# Patient Record
Sex: Female | Born: 1990
Health system: Southern US, Community
[De-identification: ages and names within clinical notes are randomized; demographics above are authoritative.]

## PROBLEM LIST (undated history)

## (undated) ENCOUNTER — Inpatient Hospital Stay: Payer: Self-pay

## (undated) DIAGNOSIS — B9689 Other specified bacterial agents as the cause of diseases classified elsewhere: Secondary | ICD-10-CM

## (undated) DIAGNOSIS — F32A Depression, unspecified: Secondary | ICD-10-CM

## (undated) DIAGNOSIS — I10 Essential (primary) hypertension: Secondary | ICD-10-CM

## (undated) DIAGNOSIS — N760 Acute vaginitis: Secondary | ICD-10-CM

## (undated) DIAGNOSIS — Z302 Encounter for sterilization: Secondary | ICD-10-CM

## (undated) DIAGNOSIS — G43909 Migraine, unspecified, not intractable, without status migrainosus: Secondary | ICD-10-CM

## (undated) DIAGNOSIS — E119 Type 2 diabetes mellitus without complications: Secondary | ICD-10-CM

## (undated) HISTORY — PX: ABDOMINAL SURGERY: SHX537

---
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
## 2008-07-29 ENCOUNTER — Emergency Department: Payer: Self-pay | Admitting: Emergency Medicine

## 2010-03-23 ENCOUNTER — Emergency Department: Payer: Self-pay | Admitting: Emergency Medicine

## 2011-09-03 ENCOUNTER — Emergency Department: Payer: Self-pay | Admitting: Emergency Medicine

## 2011-09-03 LAB — URINALYSIS, COMPLETE
Bacteria: NONE SEEN
Bilirubin,UR: NEGATIVE
Blood: NEGATIVE
Glucose,UR: NEGATIVE mg/dL (ref 0–75)
Ketone: NEGATIVE
Nitrite: NEGATIVE
RBC,UR: <1 /HPF (ref 0–5)
Specific Gravity: 1.008 (ref 1.003–1.030)
Squamous Epithelial: NONE SEEN
WBC UR: <1 /HPF (ref 0–5)

## 2011-09-03 LAB — COMPREHENSIVE METABOLIC PANEL
Albumin: 4.1 g/dL (ref 3.4–5.0)
Alkaline Phosphatase: 41 U/L — ABNORMAL LOW (ref 50–136)
BUN: 9 mg/dL (ref 7–18)
Bilirubin,Total: 0.2 mg/dL (ref 0.2–1.0)
Chloride: 104 mmol/L (ref 98–107)
Co2: 23 mmol/L (ref 21–32)
Creatinine: 0.52 mg/dL — ABNORMAL LOW (ref 0.60–1.30)
EGFR (Non-African Amer.): >60
Glucose: 93 mg/dL (ref 65–99)
Osmolality: 270 (ref 275–301)
SGOT(AST): 17 U/L (ref 15–37)
SGPT (ALT): 23 U/L
Sodium: 136 mmol/L (ref 136–145)
Total Protein: 7.8 g/dL (ref 6.4–8.2)

## 2011-09-03 LAB — CBC
HCT: 39.6 % (ref 35.0–47.0)
HGB: 13.3 g/dL (ref 12.0–16.0)
MCV: 93 fL (ref 80–100)
RBC: 4.26 10*6/uL (ref 3.80–5.20)
RDW: 12.5 % (ref 11.5–14.5)
WBC: 13.3 10*3/uL — ABNORMAL HIGH (ref 3.6–11.0)

## 2011-09-03 LAB — PREGNANCY, URINE: Pregnancy Test, Urine: POSITIVE m[IU]/mL

## 2011-09-04 ENCOUNTER — Emergency Department: Payer: Self-pay | Admitting: Emergency Medicine

## 2011-09-11 ENCOUNTER — Ambulatory Visit: Payer: Self-pay | Admitting: Obstetrics and Gynecology

## 2011-10-28 ENCOUNTER — Encounter: Payer: Self-pay | Admitting: Maternal & Fetal Medicine

## 2012-01-13 ENCOUNTER — Observation Stay: Payer: Self-pay | Admitting: Emergency Medicine

## 2012-03-31 ENCOUNTER — Emergency Department: Payer: Self-pay | Admitting: Emergency Medicine

## 2012-04-10 ENCOUNTER — Emergency Department: Payer: Self-pay | Admitting: Emergency Medicine

## 2012-05-03 ENCOUNTER — Inpatient Hospital Stay: Payer: Self-pay | Admitting: Obstetrics and Gynecology

## 2012-05-03 LAB — PROTEIN / CREATININE RATIO, URINE: Protein/Creat. Ratio: 188 mg/gCREAT (ref 0–200)

## 2012-05-03 LAB — PLATELET COUNT: Platelet: 260 10*3/uL (ref 150–440)

## 2012-05-03 LAB — BASIC METABOLIC PANEL
BUN: 6 mg/dL — ABNORMAL LOW (ref 7–18)
Calcium, Total: 8.7 mg/dL (ref 8.5–10.1)
Creatinine: 0.59 mg/dL — ABNORMAL LOW (ref 0.60–1.30)
EGFR (African American): >60
EGFR (Non-African Amer.): >60
Glucose: 100 mg/dL — ABNORMAL HIGH (ref 65–99)
Potassium: 3.4 mmol/L — ABNORMAL LOW (ref 3.5–5.1)
Sodium: 138 mmol/L (ref 136–145)

## 2012-05-03 LAB — SGOT (AST)(ARMC): SGOT(AST): 18 U/L (ref 15–37)

## 2012-05-03 LAB — HEMATOCRIT: HCT: 36 % (ref 35.0–47.0)

## 2012-05-03 LAB — URIC ACID: Uric Acid: 3.4 mg/dL (ref 2.6–6.0)

## 2012-05-05 LAB — HEMATOCRIT: HCT: 31.9 % — ABNORMAL LOW (ref 35.0–47.0)

## 2012-05-14 ENCOUNTER — Emergency Department: Payer: Self-pay | Admitting: Emergency Medicine

## 2012-05-14 LAB — BASIC METABOLIC PANEL
Anion Gap: 4 — ABNORMAL LOW (ref 7–16)
Calcium, Total: 8.9 mg/dL (ref 8.5–10.1)
Chloride: 107 mmol/L (ref 98–107)
Co2: 28 mmol/L (ref 21–32)
Creatinine: 0.63 mg/dL (ref 0.60–1.30)
EGFR (African American): >60
EGFR (Non-African Amer.): >60
Sodium: 139 mmol/L (ref 136–145)

## 2012-05-14 LAB — CBC
HGB: 12.9 g/dL (ref 12.0–16.0)
MCH: 30.5 pg (ref 26.0–34.0)
MCV: 90 fL (ref 80–100)
Platelet: 413 10*3/uL (ref 150–440)
RDW: 13 % (ref 11.5–14.5)
WBC: 8.7 10*3/uL (ref 3.6–11.0)

## 2012-05-14 LAB — CK TOTAL AND CKMB (NOT AT ARMC): CK, Total: 98 U/L (ref 21–215)

## 2012-05-14 LAB — TROPONIN I: Troponin-I: <0.02 ng/mL

## 2012-05-14 LAB — LIPASE, BLOOD: Lipase: 179 U/L (ref 73–393)

## 2013-02-18 ENCOUNTER — Emergency Department: Payer: Self-pay | Admitting: Emergency Medicine

## 2014-02-18 ENCOUNTER — Ambulatory Visit: Payer: Self-pay | Admitting: Obstetrics and Gynecology

## 2014-03-15 DIAGNOSIS — G43009 Migraine without aura, not intractable, without status migrainosus: Secondary | ICD-10-CM | POA: Insufficient documentation

## 2014-06-14 ENCOUNTER — Emergency Department: Payer: Self-pay | Admitting: Emergency Medicine

## 2014-07-21 ENCOUNTER — Emergency Department: Payer: Self-pay | Admitting: Emergency Medicine

## 2014-08-24 ENCOUNTER — Emergency Department
Admit: 2014-08-24 | Disposition: A | Discharge status: Home / Self Care | Payer: Self-pay | Admitting: Emergency Medicine

## 2014-09-05 ENCOUNTER — Ambulatory Visit
Admit: 2014-09-05 | Disposition: A | Discharge status: Home / Self Care | Payer: Self-pay | Attending: Unknown Physician Specialty | Admitting: Unknown Physician Specialty

## 2014-09-13 NOTE — Discharge Summary (Signed)
PATIENT NAME:  [name], Susan Shelton DATE OF BIRTH:  [birth year]  DATE OF ADMISSION:  05/03/2012 DATE OF DISCHARGE:  05/07/2012  PRINCIPAL PROCEDURE: Induction followed by primary low transverse cesarean section for active phase arrest and nonreassuring fetal monitoring.   HOSPITAL COURSE: The patient was brought in for induction at 39 + 1 day for chronic hypertension. On hospital day number two, the patient labored to 4 cm and did not progress past 4 cm. The fetus developed a 11 minute fetal bradycardic episode which prompted primary low transverse cesarean section. The patient postoperatively did well. On postoperative day number one, hematocrit 31.9%. On postoperative day number three, the patient is tolerating a regular diet without complaints. She is taking Norco 5/325 mg for pain relief. The patient wishes to go on Micronor for contraception.   DISCHARGE CONDITION: Good.  DISCHARGE MEDICATIONS:  1. Norco 5/325 mg 1 to 2 tablets every 4 to 6 hours. 2. Naprosyn 500 mg 1 tablet every 12 hours. 3. Micronor 1 tablet daily.   DISCHARGE FOLLOWUP: The patient will follow up with Dr. Feliberto GottronSchermerhorn in two weeks for wound care or before if she has wound drainage, fever, nausea, vomiting, or increasing abdominal pain.  ____________________________ Suzy Bouchardhomas J. Schermerhorn, MD tjs:slb D: 05/07/2012 09:32:24 ET T: 05/07/2012 10:08:12 ET JOB#: [id number]  cc: Suzy Bouchardhomas J. Schermerhorn, MD, <Dictator>  Suzy BouchardHOMAS J SCHERMERHORN MD ELECTRONICALLY SIGNED 05/25/2012 22:23

## 2014-09-13 NOTE — Op Note (Signed)
PATIENT NAME:  Susan Shelton, Susan Shelton MR#:  [id number] DATE OF BIRTH:  01-Mar-1991  DATE OF PROCEDURE:  05/04/2012  PREOPERATIVE DIAGNOSES:  1. Term gestation.  2. Gestational hypertension.  3. Nonreassuring fetal monitoring.   POSTOPERATIVE DIAGNOSES: 1. Term gestation.  2. Gestational hypertension.  3. Nonreassuring fetal monitoring.   PROCEDURE: Primary low transverse Cesarean section.   SURGEON: Suzy Bouchardhomas J. Jettie Mannor, MD   FIRST ASSISTANT: Acquanetta BellingAngela Lugiano, CNM    ANESTHESIA: Spinal.   INDICATIONS: This is a 24 year old gravida 1 para 0 at 5139 + 2 days. The patient was brought in for an induction of labor given her gestational hypertension. The patient on hospital day #2 progressed to 4 cm, did not make continued progression and had an 11 minute decel which responded to intrauterine resuscitation. The patient was brought to the operating room for primary low transverse Cesarean section.   PROCEDURE: After adequate spinal anesthesia, the patient was placed in the dorsal supine position with a hip roll under the right side. The patient's abdomen was prepped and draped in a normal sterile fashion. She did receive 2 grams IV Ancef prior to commencement of the case. Pfannenstiel incision was made two fingerbreadths above the symphysis pubis. Sharp dissection was used to identify the fascia. Fascia was opened in the midline and opened in a transverse fashion. Superior aspect of the fascia was grasped with Kocher clamps and the recti muscles dissected free. Inferior aspect of the fascia was grasped with Kocher clamps and the pyramidalis muscle was dissected free. Entry into the peritoneal cavity was accomplished sharply. Vesicouterine peritoneal fold was identified and opened in a transverse fashion and the bladder flap was created and the bladder was reflected inferiorly. A low transverse uterine incision was made. Upon entry into the endometrial cavity, clear fluid resulted. OP presentation was noted.  Fetal head was delivered through the incision followed by shoulders, arms, and legs. Legs were noted to be folded and a wrapped umbilical cord around the right leg. Female was vigorous, passed to Dr. Abundio MiuHorowitz who assigned Apgar scores of 9 and 9. Placenta was delivered manually and the uterus was exteriorized. Intravenous Pitocin was administered 40 units per 1000 mL bag of fluid. Endometrial cavity was wiped clean with laparotomy tape. The uterine incision was closed with 1 chromic suture in a running locking fashion with good approximation of edges. Good hemostasis was noted. One additional figure-of-eight suture was required for hemostasis. Fallopian tubes and ovaries appeared normal. The posterior cul-de-sac was irrigated and suctioned. The uterus was placed back into the abdominal cavity. Paracolic gutters were wiped cleaned bilaterally. Uterine incision again appeared hemostatic. Interceed applied over the uterine incision and the fascia was then closed with 0 Vicryl suture in a running nonlocking fashion with good approximation of edges. Good hemostasis was noted. Subcutaneous tissues were irrigated and bovied for hemostasis and the skin was reapplied with Insorb absorbable staples. The patient tolerated the procedure well. Estimated blood loss 500 mL. Intraoperative fluids 1200 mL. The patient was taken to the recovery room in good condition.   ____________________________ Suzy Bouchardhomas J. Mahmoud Blazejewski, MD tjs:drc D: 05/04/2012 18:22:05 ET T: 05/05/2012 09:51:17 ET JOB#: [id number]  cc: Suzy Bouchardhomas J. Gillis Boardley, MD, <Dictator> Suzy BouchardHOMAS J Lydiana Milley MD ELECTRONICALLY SIGNED 05/25/2012 22:23

## 2014-10-04 NOTE — H&P (Signed)
L&D Evaluation:  History:   HPI 24 y/o G1 @ 39/2 wks EDC 05/09/12 here for IOL due to Gestational hypertension Aldomet 250mg  TID, NL US growth and AFI. Care @ [facility]. Obesity, GBS negative. occasinal contraction, denies leaking fluid or vaginal bleeding, baby is active. Denies headache, nausea, visual disturbances, vomiting, RUQ or epigastric pain.    Presents with above    Patient's Medical History Hypertension    Medications Pre Natal Vitamins    Allergies NKDA    Social History none    Family History Non-Contributory   ROS:   ROS All systems were reviewed.  HEENT, CNS, GI, GU, Respiratory, CV, Renal and Musculoskeletal systems were found to be normal.   Exam:   Vital Signs stable  104/54   138/58   139/67    Urine Protein not completed    General no apparent distress    Mental Status clear    Chest clear    Heart normal sinus rhythm    Abdomen gravid, non-tender    Estimated Fetal Weight Average for gestational age    Fetal Position vtx    Fundal Height term    Back no CVAT    Edema no edema    Reflexes 2+    Clonus negative    Pelvic no external lesions, 2cm 50% vtx @ -2 no show BOWI intact    Mebranes Intact    FHT normal rate with no decels, baseline 140's 150's avg variability with multiple accels    Fetal Heart Rate 156    Ucx irregular, irregular mild    Skin dry    Lymph no lymphadenopathy   Impression:   Impression IOL @ term Gestational Hypertension   Plan:   Plan EFM/NST, monitor contractions and for cervical change, monitor BP, PIH panel    Comments Cervidil removed, will begin pitocin. PIH panel NL, Metabolic panel potassium 3.4. Partner present, dozing. DC what to expect with IOL, questions answered, consents signed prior. Explained pain management options with labor progress. TJS aware of asssessment and plan of care.   Electronic Signatures: Albertina ParrLugiano, Demarus Latterell B (CNM)  (Signed 09-Dec-13 08:48)  Authored: L&D Evaluation   Last  Updated: 09-Dec-13 08:48 by Albertina ParrLugiano, Roxanne Panek B (CNM)

## 2015-01-18 ENCOUNTER — Emergency Department
Admission: EM | Admit: 2015-01-18 | Discharge: 2015-01-18 | Disposition: A | Discharge status: Home / Self Care | Payer: 59 | Attending: Student | Admitting: Student

## 2015-01-18 ENCOUNTER — Encounter: Payer: Self-pay | Admitting: *Deleted

## 2015-01-18 DIAGNOSIS — Y998 Other external cause status: Secondary | ICD-10-CM | POA: Insufficient documentation

## 2015-01-18 DIAGNOSIS — S39012A Strain of muscle, fascia and tendon of lower back, initial encounter: Secondary | ICD-10-CM | POA: Diagnosis not present

## 2015-01-18 DIAGNOSIS — Y92410 Unspecified street and highway as the place of occurrence of the external cause: Secondary | ICD-10-CM | POA: Diagnosis not present

## 2015-01-18 DIAGNOSIS — S20212A Contusion of left front wall of thorax, initial encounter: Secondary | ICD-10-CM | POA: Diagnosis not present

## 2015-01-18 DIAGNOSIS — Z720 Tobacco use: Secondary | ICD-10-CM | POA: Insufficient documentation

## 2015-01-18 DIAGNOSIS — S161XXA Strain of muscle, fascia and tendon at neck level, initial encounter: Secondary | ICD-10-CM | POA: Diagnosis not present

## 2015-01-18 DIAGNOSIS — S8001XA Contusion of right knee, initial encounter: Secondary | ICD-10-CM | POA: Insufficient documentation

## 2015-01-18 DIAGNOSIS — S199XXA Unspecified injury of neck, initial encounter: Secondary | ICD-10-CM | POA: Diagnosis present

## 2015-01-18 DIAGNOSIS — Y9389 Activity, other specified: Secondary | ICD-10-CM | POA: Insufficient documentation

## 2015-01-18 HISTORY — DX: Migraine, unspecified, not intractable, without status migrainosus: G43.909

## 2015-01-18 MED ORDER — CYCLOBENZAPRINE HCL 10 MG PO TABS: 10.0000 mg | ORAL_TABLET | Freq: Three times a day (TID) | ORAL | Status: DC | PRN

## 2015-01-18 MED ORDER — IBUPROFEN 800 MG PO TABS
800.0000 mg | ORAL_TABLET | Freq: Once | ORAL | Status: AC
  Administered 2015-01-18: 800 mg via ORAL
  Filled 2015-01-18: qty 1

## 2015-01-18 MED ORDER — IBUPROFEN 800 MG PO TABS: 800.0000 mg | ORAL_TABLET | Freq: Three times a day (TID) | ORAL | Status: DC | PRN

## 2015-01-18 MED ORDER — TRAMADOL HCL 50 MG PO TABS: 50.0000 mg | ORAL_TABLET | Freq: Four times a day (QID) | ORAL | Status: DC | PRN

## 2015-01-18 MED ORDER — METHOCARBAMOL 500 MG PO TABS
1000.0000 mg | ORAL_TABLET | Freq: Once | ORAL | Status: AC
  Administered 2015-01-18: 1000 mg via ORAL
  Filled 2015-01-18: qty 2

## 2015-01-18 MED ORDER — METHOCARBAMOL 1000 MG/10ML IJ SOLN: 1000.0000 mg | Freq: Once | INTRAMUSCULAR | Status: DC

## 2015-01-18 MED ORDER — TRAMADOL HCL 50 MG PO TABS
50.0000 mg | ORAL_TABLET | Freq: Once | ORAL | Status: AC
  Administered 2015-01-18: 50 mg via ORAL
  Filled 2015-01-18: qty 1
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
## 2015-02-24 DIAGNOSIS — R7303 Prediabetes: Secondary | ICD-10-CM | POA: Insufficient documentation

## 2015-02-24 DIAGNOSIS — Z6829 Body mass index (BMI) 29.0-29.9, adult: Secondary | ICD-10-CM | POA: Insufficient documentation

## 2015-03-07 ENCOUNTER — Encounter: Payer: Self-pay | Admitting: Emergency Medicine

## 2015-03-07 ENCOUNTER — Emergency Department
Admission: EM | Admit: 2015-03-07 | Discharge: 2015-03-07 | Disposition: A | Discharge status: Home / Self Care | Payer: 59 | Attending: Emergency Medicine | Admitting: Emergency Medicine

## 2015-03-07 ENCOUNTER — Emergency Department: Payer: 59

## 2015-03-07 DIAGNOSIS — G8929 Other chronic pain: Secondary | ICD-10-CM | POA: Insufficient documentation

## 2015-03-07 DIAGNOSIS — R51 Headache: Secondary | ICD-10-CM | POA: Diagnosis not present

## 2015-03-07 DIAGNOSIS — Z720 Tobacco use: Secondary | ICD-10-CM | POA: Diagnosis not present

## 2015-03-07 DIAGNOSIS — R519 Headache, unspecified: Secondary | ICD-10-CM

## 2015-03-07 DIAGNOSIS — Z3202 Encounter for pregnancy test, result negative: Secondary | ICD-10-CM | POA: Diagnosis not present

## 2015-03-07 LAB — POCT PREGNANCY, URINE: PREG TEST UR: NEGATIVE

## 2015-03-07 LAB — GLUCOSE, CAPILLARY: Glucose-Capillary: 96 mg/dL (ref 65–99)

## 2015-03-07 MED ORDER — KETOROLAC TROMETHAMINE 30 MG/ML IJ SOLN
30.0000 mg | Freq: Once | INTRAMUSCULAR | Status: AC
  Administered 2015-03-07: 30 mg via INTRAVENOUS
  Filled 2015-03-07: qty 1

## 2015-03-07 MED ORDER — SODIUM CHLORIDE 0.9 % IV BOLUS (SEPSIS)
1000.0000 mL | Freq: Once | INTRAVENOUS | Status: AC
Start: 2015-03-07 — End: 2015-03-07
  Administered 2015-03-07: 1000 mL via INTRAVENOUS

## 2015-03-07 MED ORDER — DIPHENHYDRAMINE HCL 50 MG/ML IJ SOLN
25.0000 mg | Freq: Once | INTRAMUSCULAR | Status: AC
  Administered 2015-03-07: 25 mg via INTRAVENOUS
  Filled 2015-03-07: qty 1

## 2015-03-07 MED ORDER — PROCHLORPERAZINE EDISYLATE 5 MG/ML IJ SOLN
10.0000 mg | Freq: Once | INTRAMUSCULAR | Status: AC
  Administered 2015-03-07: 10 mg via INTRAVENOUS
  Filled 2015-03-07 (×2): qty 2

## 2015-03-07 MED ORDER — BUTALBITAL-APAP-CAFFEINE 50-325-40 MG PO TABS
1.0000 | ORAL_TABLET | Freq: Four times a day (QID) | ORAL | Status: DC | PRN
Start: 2015-03-07 — End: 2016-02-23

## 2015-03-07 NOTE — ED Notes (Signed)
Pt presents to ED from home via EMS with headache that has lasted all day accompanied by nausea and at times hyperventilating. Pt states this feels like a migraine headache (hx). Pt describes this as a continuous throbbing pain around head that radiates to neck. 10/10 pain

## 2015-03-07 NOTE — ED Provider Notes (Addendum)
[facility] Emergency Department Provider Note  ____________________________________________  Time seen: Approximately 7:05 PM  I have reviewed the triage vital signs and the nursing notes.   HISTORY  Chief Complaint Migraine    HPI Susan Shelton is a 24 y.o. female with a history of headaches was presenting today with worsening headaches over the past 3-4 months. She says that she is now getting her headaches every weekend. She says that this started out small and increased to a 10 out of 10 slowly. Her headache today started at 11 AM and progressed to a 10 out of 10 over the course the day. She has nausea but no vomiting. She has photophobia. It occasionally causes her to hyperventilate. Has been using tramadol at home without relief. Has also tried ibuprofen at home without relief. Says has been to the emergency department multiple times for this for medications. Has also discussed it with her primary care doctor, Dr. Thedore Mins. Denies any focal weakness or dizziness.No family history of brain cancer.   Past Medical History  Diagnosis Date  . Migraines     There are no active problems to display for this patient.   History reviewed. No pertinent past surgical history.  Current Outpatient Rx  Name  Route  Sig  Dispense  Refill  . cyclobenzaprine (FLEXERIL) 10 MG tablet   Oral   Take 1 tablet (10 mg total) by mouth every 8 (eight) hours as needed for muscle spasms.   15 tablet   0   . ibuprofen (ADVIL,MOTRIN) 800 MG tablet   Oral   Take 1 tablet (800 mg total) by mouth every 8 (eight) hours as needed for moderate pain.   15 tablet   0   . traMADol (ULTRAM) 50 MG tablet   Oral   Take 1 tablet (50 mg total) by mouth every 6 (six) hours as needed for moderate pain.   12 tablet   0     Allergies Review of patient's allergies indicates no known allergies.  History reviewed. No pertinent family history.  Social History Social History   Substance Use Topics  . Smoking status: Current Every Day Smoker -- 1.00 packs/day    Types: Cigarettes  . Smokeless tobacco: None  . Alcohol Use: No    Review of Systems Constitutional: No fever/chills Eyes: No visual changes. ENT: No sore throat. Cardiovascular: Denies chest pain. Respiratory: Denies shortness of breath. Gastrointestinal: No abdominal pain.  No nausea, no vomiting.  No diarrhea.  No constipation. Genitourinary: Negative for dysuria. Musculoskeletal: Negative for back pain. Skin: Negative for rash. Neurological: Negative for focal weakness or numbness.  10-point ROS otherwise negative.  ____________________________________________   PHYSICAL EXAM:  VITAL SIGNS: ED Triage Vitals  Enc Vitals Group     BP 03/07/15 1841 121/76 mmHg     Pulse Rate 03/07/15 1841 70     Resp 03/07/15 1841 18     Temp 03/07/15 1841 97.9 F (36.6 C)     Temp Source 03/07/15 1841 Oral     SpO2 03/07/15 1841 100 %     Weight 03/07/15 1841 168 lb (76.204 kg)     Height 03/07/15 1841  (1.6 m)     Head Cir --      Peak Flow --      Pain Score 03/07/15 1842 10     Pain Loc --      Pain Edu? --      Excl. in GC? --  Constitutional: Alert and oriented. Well appearing and in no acute distress. Eyes: Conjunctivae are normal. PERRL. EOMI. Head: Atraumatic. Nose: No congestion/rhinnorhea. Mouth/Throat: Mucous membranes are moist.  Oropharynx non-erythematous. Neck: No stridor.   Cardiovascular: Normal rate, regular rhythm. Grossly normal heart sounds.  Good peripheral circulation. Respiratory: Normal respiratory effort.  No retractions. Lungs CTAB. Gastrointestinal: Soft and nontender. No distention. No abdominal bruits. No CVA tenderness. Musculoskeletal: No lower extremity tenderness nor edema.  No joint effusions. Neurologic:  Normal speech and language. No gross focal neurologic deficits are appreciated. No gait instability. No ataxia on finger to nose testing. No  nystagmus.  Skin:  Skin is warm, dry and intact. No rash noted. Psychiatric: Mood and affect are normal. Speech and behavior are normal.  ____________________________________________   LABS (all labs ordered are listed, but only abnormal results are displayed)  Labs Reviewed  POC URINE PREG, ED   ____________________________________________  EKG   ____________________________________________  RADIOLOGY  No acute findings on the CAT scan of the brain. ____________________________________________   PROCEDURES    ____________________________________________   INITIAL IMPRESSION / ASSESSMENT AND PLAN / ED COURSE  Pertinent labs & imaging results that were available during my care of the patient were reviewed by me and considered in my medical decision making (see chart for details).  ----------------------------------------- 8:47 PM on 03/07/2015 -----------------------------------------  Patient resting comfortably at this time but still a 7 out of 10 pain. We'll add Toradol. We'll reassess. Discussed with the patient the normal CAT scan findings.  ----------------------------------------- 9:34 PM on 03/07/2015 -----------------------------------------  Patient resting comfortably at this time. Says headache is gone. Likely acute exacerbation of chronic headaches. Discussed with the patient following up with her primary care doctor but will also give follow-up with neurology. We'll also discharge on Fioricet which the patient says she has not had in the past. ____________________________________________   FINAL CLINICAL IMPRESSION(S) / ED DIAGNOSES  Acute on chronic headache. Resolved. Initial visit.    Myrna Blazer, MD 03/07/15 2135  Do not suspect subarachnoid hemorrhage or other intracranial bleeding. No sudden onset headache. Chronic and with a normal CT and neuro exam.  Myrna Blazer, MD 03/07/15 2138

## 2015-03-07 NOTE — ED Notes (Signed)
POCT PREG NEGATIVE. °

## 2015-06-24 ENCOUNTER — Emergency Department
Admission: EM | Admit: 2015-06-24 | Discharge: 2015-06-24 | Disposition: A | Discharge status: Home / Self Care | Payer: 59 | Attending: Emergency Medicine | Admitting: Emergency Medicine

## 2015-06-24 ENCOUNTER — Encounter: Payer: Self-pay | Admitting: *Deleted

## 2015-06-24 DIAGNOSIS — Y9389 Activity, other specified: Secondary | ICD-10-CM | POA: Diagnosis not present

## 2015-06-24 DIAGNOSIS — S30814A Abrasion of vagina and vulva, initial encounter: Secondary | ICD-10-CM | POA: Diagnosis not present

## 2015-06-24 DIAGNOSIS — N762 Acute vulvitis: Secondary | ICD-10-CM | POA: Insufficient documentation

## 2015-06-24 DIAGNOSIS — Y9289 Other specified places as the place of occurrence of the external cause: Secondary | ICD-10-CM | POA: Insufficient documentation

## 2015-06-24 DIAGNOSIS — X58XXXA Exposure to other specified factors, initial encounter: Secondary | ICD-10-CM | POA: Diagnosis not present

## 2015-06-24 DIAGNOSIS — Y998 Other external cause status: Secondary | ICD-10-CM | POA: Diagnosis not present

## 2015-06-24 DIAGNOSIS — J029 Acute pharyngitis, unspecified: Secondary | ICD-10-CM | POA: Diagnosis present

## 2015-06-24 DIAGNOSIS — F17210 Nicotine dependence, cigarettes, uncomplicated: Secondary | ICD-10-CM | POA: Diagnosis not present

## 2015-06-24 DIAGNOSIS — J039 Acute tonsillitis, unspecified: Secondary | ICD-10-CM | POA: Diagnosis not present

## 2015-06-24 MED ORDER — MUPIROCIN 2 % EX OINT: TOPICAL_OINTMENT | CUTANEOUS | Status: DC

## 2015-06-24 MED ORDER — AMOXICILLIN-POT CLAVULANATE 875-125 MG PO TABS: 1.0000 | ORAL_TABLET | Freq: Two times a day (BID) | ORAL | Status: DC

## 2015-06-24 NOTE — ED Notes (Signed)
Pt also c/o pain in groin region, stating that she cut herself shaving and that "now there's a bump"

## 2015-06-24 NOTE — Discharge Instructions (Signed)
Tonsillitis Tonsillitis is an infection of the throat. This infection causes the tonsils to become red, tender, and puffy (swollen). Tonsils are groups of tissue at the back of your throat. If bacteria caused your infection, antibiotic medicine will be given to you. Sometimes symptoms of tonsillitis can be relieved with the use of steroid medicine. If your tonsillitis is severe and happens often, you may need to get your tonsils removed (tonsillectomy). HOME CARE   Rest and sleep often.  Drink enough fluids to keep your pee (urine) clear or pale yellow.  While your throat is sore, eat soft or liquid foods like:  Soup.  Ice cream.  Instant breakfast drinks.  Eat frozen ice pops.  Gargle with a warm or cold liquid to help soothe the throat. Gargle with a water and salt mix. Mix 1/4 teaspoon of salt and 1/4 teaspoon of baking soda in 1 cup of water.  Only take medicines as told by your doctor.  If you are given medicines (antibiotics), take them as told. Finish them even if you start to feel better. GET HELP IF:  You have large, tender lumps in your neck.  You have a rash.  You cough up green, yellow-brown, or bloody fluid.  You cannot swallow liquids or food for 24 hours.  You notice that only one of your tonsils is swollen. GET HELP RIGHT AWAY IF:   You throw up (vomit).  You have a very bad headache.  You have a stiff neck.  You have chest pain.  You have trouble breathing or swallowing.  You have bad throat pain, drooling, or your voice changes.  You have bad pain not helped by medicine.  You cannot fully open your mouth.  You have redness, puffiness, or bad pain in the neck.  You have a fever. MAKE SURE YOU:   Understand these instructions.  Will watch your condition.  Will get help right away if you are not doing well or get worse.   This information is not intended to replace advice given to you by your health care provider. Make sure you discuss any  questions you have with your health care provider.   Document Released: 10/30/2007 Document Revised: 05/18/2013 Document Reviewed: 10/30/2012 Elsevier Interactive Patient Education [employer]   Take the antibiotic as directed. Keep the wound area clean, dry, and covered as discussed. Apply the antibiotic ointment as directed. For the throat, mix equal parts of Maalox with Benadryl elixir, and gargle as needed. Follow-up with your provider for ongoing symptoms.

## 2015-06-24 NOTE — ED Provider Notes (Signed)
[facility] Emergency Department Provider Note ____________________________________________  Time seen: 1705  I have reviewed the triage vital signs and the nursing notes.  HISTORY  Chief Complaint  Sore Throat and Abscess  HPI Susan Shelton is a 25 y.o. female presents to the ED for evaluation of sore throat and swollen tonsils that she noted about 2 days prior. She denies interim fevers, chills, sweats. She does give a history of recurrent strep as a child and intermittent strep infections as an adult. She describes noting sore throat pain on the right greater than left side and noticing some white spots on and behind the right tonsil. She denies any sick contacts, recent travel, nausea, or rashes. She also has unrelated concern for a tender knot she's noted in her groin after she accidentally cut herself in the bikini area last week. She describes a cut to the perineal area that's it still tender. She notes pain to the area when she separates the skin between the labia and her lower thigh. She denies any bleeding from the area has not noted any significant redness, drainage, or weeping. Earlier this week she noted a tender nodule to the left groin region. She describes it acute pain in her throat at a 4/10 in triage.  Past Medical History  Diagnosis Date  . Migraines     There are no active problems to display for this patient.   History reviewed. No pertinent past surgical history.  Current Outpatient Rx  Name  Route  Sig  Dispense  Refill  . amoxicillin-clavulanate (AUGMENTIN) 875-125 MG tablet   Oral   Take 1 tablet by mouth 2 (two) times daily.   20 tablet   0   . butalbital-acetaminophen-caffeine (FIORICET) 50-325-40 MG tablet   Oral   Take 1-2 tablets by mouth every 6 (six) hours as needed for headache.   20 tablet   0   . cyclobenzaprine (FLEXERIL) 10 MG tablet   Oral   Take 1 tablet (10 mg total) by mouth every 8 (eight) hours as needed  for muscle spasms.   15 tablet   0   . ibuprofen (ADVIL,MOTRIN) 800 MG tablet   Oral   Take 1 tablet (800 mg total) by mouth every 8 (eight) hours as needed for moderate pain.   15 tablet   0   . mupirocin ointment (BACTROBAN) 2 %      Apply to affected area 3 times daily   22 g   0   . traMADol (ULTRAM) 50 MG tablet   Oral   Take 1 tablet (50 mg total) by mouth every 6 (six) hours as needed for moderate pain.   12 tablet   0    Allergies Doxycycline  History reviewed. No pertinent family history.  Social History Social History  Substance Use Topics  . Smoking status: Current Every Day Smoker -- 1.00 packs/day    Types: Cigarettes  . Smokeless tobacco: None  . Alcohol Use: No   Review of Systems  Constitutional: Negative for fever. Eyes: Negative for visual changes. ENT: Positive for sore throat. Cardiovascular: Negative for chest pain. Respiratory: Negative for shortness of breath. Gastrointestinal: Negative for abdominal pain, vomiting and diarrhea. Genitourinary: Negative for dysuria. Musculoskeletal: Negative for back pain. Skin: Negative for rash. Neurological: Negative for headaches, focal weakness or numbness. ____________________________________________  PHYSICAL EXAM:  VITAL SIGNS: ED Triage Vitals  Enc Vitals Group     BP 06/24/15 1603 136/81 mmHg  Pulse Rate 06/24/15 1603 105     Resp 06/24/15 1603 18     Temp 06/24/15 1603 99.6 F (37.6 C)     Temp Source 06/24/15 1603 Oral     SpO2 06/24/15 1603 100 %     Weight 06/24/15 1603 175 lb (79.379 kg)     Height 06/24/15 1603  (1.6 m)     Head Cir --      Peak Flow --      Pain Score 06/24/15 1604 0     Pain Loc --      Pain Edu? --      Excl. in GC? --    Constitutional: Alert and oriented. Well appearing and in no distress. Head: Normocephalic and atraumatic.      Eyes: Conjunctivae are normal. PERRL. Normal extraocular movements      Ears: Canals clear. TMs intact  bilaterally.   Nose: No congestion/rhinorrhea.   Mouth/Throat: Mucous membranes are moist.   Neck: Supple. No thyromegaly. Hematological/Lymphatic/Immunological: No cervical lymphadenopathy. Palpable left > right inguinal lymph nodes noted.  Cardiovascular: Normal rate, regular rhythm.  Respiratory: Normal respiratory effort. No wheezes/rales/rhonchi. Gastrointestinal: Soft and nontender. No distention, rebound, guarding, or organomegaly.  GU: Normal external genitalia. Patient with a superficial skin abrasion with some mild local erythema noted to the intertriginous space between the left labia and the upper thigh, at the perineum.  Musculoskeletal: Nontender with normal range of motion in all extremities.  Neurologic:  Normal gait without ataxia. Normal speech and language. No gross focal neurologic deficits are appreciated. Skin:  Skin is warm, dry and intact. No rash noted. Psychiatric: Mood and affect are normal. Patient exhibits appropriate insight and judgment. ____________________________________________   LABS (pertinent positives/negatives) Labs Reviewed  WOUND CULTURE  ____________________________________________  INITIAL IMPRESSION / ASSESSMENT AND PLAN / ED COURSE  Patient with a tonsillitis with exudate be treated empirically for strep pharyngitis with amoxicillin. She is also got a vulvar area cellulitis with inguinal lymph node involvement. Wound culture is collected at the time of evaluation. She is encouraged to apply prescription mupirocin to the area twice daily. She will follow with the primary care provider for ongoing symptom management. ____________________________________________  FINAL CLINICAL IMPRESSION(S) / ED DIAGNOSES  Final diagnoses:  Tonsillitis with exudate  Vulvar cellulitis      Lissa Hoard, PA-C 06/25/15 2200  Jennye Moccasin, MD 06/25/15 [phone]

## 2015-06-24 NOTE — ED Notes (Signed)
States sore throat and a hard knot inside her vagina with pain

## 2015-06-28 LAB — WOUND CULTURE: Special Requests: NORMAL

## 2015-09-15 DIAGNOSIS — B9689 Other specified bacterial agents as the cause of diseases classified elsewhere: Secondary | ICD-10-CM | POA: Diagnosis not present

## 2015-09-15 DIAGNOSIS — N760 Acute vaginitis: Secondary | ICD-10-CM | POA: Diagnosis not present

## 2015-09-25 ENCOUNTER — Emergency Department
Admission: EM | Admit: 2015-09-25 | Discharge: 2015-09-25 | Disposition: A | Discharge status: Home / Self Care | Payer: 59 | Attending: Emergency Medicine | Admitting: Emergency Medicine

## 2015-09-25 ENCOUNTER — Encounter: Payer: Self-pay | Admitting: Emergency Medicine

## 2015-09-25 DIAGNOSIS — K1379 Other lesions of oral mucosa: Secondary | ICD-10-CM | POA: Insufficient documentation

## 2015-09-25 DIAGNOSIS — Z202 Contact with and (suspected) exposure to infections with a predominantly sexual mode of transmission: Secondary | ICD-10-CM | POA: Diagnosis present

## 2015-09-25 DIAGNOSIS — F17210 Nicotine dependence, cigarettes, uncomplicated: Secondary | ICD-10-CM | POA: Diagnosis not present

## 2015-09-25 HISTORY — DX: Acute vaginitis: N76.0

## 2015-09-25 HISTORY — DX: Other specified bacterial agents as the cause of diseases classified elsewhere: B96.89

## 2015-09-25 LAB — WET PREP, GENITAL
Sperm: NONE SEEN
Trich, Wet Prep: NONE SEEN
Yeast Wet Prep HPF POC: NONE SEEN

## 2015-09-25 LAB — CBC WITH DIFFERENTIAL/PLATELET
BASOS PCT: 1 %
Basophils Absolute: 0.1 10*3/uL (ref 0–0.1)
EOS PCT: 2 %
Eosinophils Absolute: 0.2 10*3/uL (ref 0–0.7)
HEMATOCRIT: 40.2 % (ref 35.0–47.0)
HEMOGLOBIN: 13.2 g/dL (ref 12.0–16.0)
LYMPHS PCT: 25 %
Lymphs Abs: 2.2 10*3/uL (ref 1.0–3.6)
MCH: 30 pg (ref 26.0–34.0)
MCHC: 33 g/dL (ref 32.0–36.0)
MCV: 90.9 fL (ref 80.0–100.0)
MONOS PCT: 8 %
Monocytes Absolute: 0.7 10*3/uL (ref 0.2–0.9)
NEUTROS PCT: 64 %
Neutro Abs: 5.6 10*3/uL (ref 1.4–6.5)
Platelets: 234 10*3/uL (ref 150–440)
RBC: 4.42 MIL/uL (ref 3.80–5.20)
RDW: 13.9 % (ref 11.5–14.5)
WBC: 8.8 10*3/uL (ref 3.6–11.0)

## 2015-09-25 LAB — POCT RAPID STREP A: Streptococcus, Group A Screen (Direct): NEGATIVE

## 2015-09-25 LAB — CHLAMYDIA/NGC RT PCR (ARMC ONLY)
CHLAMYDIA TR: NOT DETECTED
N GONORRHOEAE: NOT DETECTED

## 2015-09-25 LAB — POCT PREGNANCY, URINE: PREG TEST UR: NEGATIVE

## 2015-09-25 MED ORDER — AMOXICILLIN-POT CLAVULANATE 875-125 MG PO TABS: 1.0000 | ORAL_TABLET | Freq: Two times a day (BID) | ORAL | Status: AC

## 2015-09-25 MED ORDER — PREDNISONE 20 MG PO TABS: 60.0000 mg | ORAL_TABLET | Freq: Every day | ORAL | Status: DC

## 2015-09-25 MED ORDER — DEXTROSE 5 % IV SOLN
1.0000 g | Freq: Once | INTRAVENOUS | Status: AC
  Administered 2015-09-25: 1 g via INTRAVENOUS
  Filled 2015-09-25: qty 10

## 2015-09-25 MED ORDER — DEXAMETHASONE SODIUM PHOSPHATE 10 MG/ML IJ SOLN
10.0000 mg | Freq: Once | INTRAMUSCULAR | Status: AC
  Administered 2015-09-25: 10 mg via INTRAVENOUS
  Filled 2015-09-25: qty 1
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
## 2015-09-25 NOTE — Discharge Instructions (Signed)
It is not clear why your uvula appears bigger to you. If you have any increased swelling, fever, vomiting, trouble breathing, trouble speaking, or you feel worse in any way, return to the emergency department. Follow up closely with ENT, call their office today for an appointment.

## 2015-09-25 NOTE — ED Notes (Signed)
Pt informed to return if any life threatening symptoms occur.  
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
## 2015-09-26 LAB — HIV ANTIBODY (ROUTINE TESTING W REFLEX): HIV Screen 4th Generation wRfx: NONREACTIVE

## 2015-09-26 LAB — RPR: RPR Ser Ql: NONREACTIVE

## 2015-11-24 ENCOUNTER — Emergency Department
Admission: EM | Admit: 2015-11-24 | Discharge: 2015-11-24 | Disposition: A | Discharge status: Home / Self Care | Payer: 59 | Attending: Emergency Medicine | Admitting: Emergency Medicine

## 2015-11-24 ENCOUNTER — Encounter: Payer: Self-pay | Admitting: Emergency Medicine

## 2015-11-24 DIAGNOSIS — Z79899 Other long term (current) drug therapy: Secondary | ICD-10-CM | POA: Diagnosis not present

## 2015-11-24 DIAGNOSIS — Z7952 Long term (current) use of systemic steroids: Secondary | ICD-10-CM | POA: Insufficient documentation

## 2015-11-24 DIAGNOSIS — F17210 Nicotine dependence, cigarettes, uncomplicated: Secondary | ICD-10-CM | POA: Diagnosis not present

## 2015-11-24 DIAGNOSIS — J029 Acute pharyngitis, unspecified: Secondary | ICD-10-CM | POA: Insufficient documentation

## 2015-11-24 DIAGNOSIS — N898 Other specified noninflammatory disorders of vagina: Secondary | ICD-10-CM | POA: Diagnosis not present

## 2015-11-24 LAB — WET PREP, GENITAL
CLUE CELLS WET PREP: NONE SEEN
Sperm: NONE SEEN
Trich, Wet Prep: NONE SEEN
YEAST WET PREP: NONE SEEN

## 2015-11-24 LAB — CHLAMYDIA/NGC RT PCR (ARMC ONLY)
CHLAMYDIA TR: NOT DETECTED
N gonorrhoeae: NOT DETECTED

## 2015-11-24 LAB — URINALYSIS COMPLETE WITH MICROSCOPIC (ARMC ONLY)
BACTERIA UA: NONE SEEN
BILIRUBIN URINE: NEGATIVE
GLUCOSE, UA: NEGATIVE mg/dL
Hgb urine dipstick: NEGATIVE
Ketones, ur: NEGATIVE mg/dL
Leukocytes, UA: NEGATIVE
NITRITE: NEGATIVE
Protein, ur: NEGATIVE mg/dL
SPECIFIC GRAVITY, URINE: 1.025 (ref 1.005–1.030)
pH: 6 (ref 5.0–8.0)

## 2015-11-24 LAB — POCT PREGNANCY, URINE: PREG TEST UR: NEGATIVE

## 2015-11-24 NOTE — ED Notes (Signed)
Patient presents to the ED with sore throat since May.  Patient states she was diagnosed with uveitis at that time and given antibiotics but patient denies any improvement, reports throat is still sore, the back of her throat appears reddened and patient feels that tongue is whiter than usual.  Patient also reports that her vaginal area is more "moist" than usual and then odor is different.  Patient states, "I know I have a bacterial infection."  Patient is eating fast food in triage and is in no obvious distress at this time.

## 2015-11-24 NOTE — ED Provider Notes (Signed)
[facility] Emergency Department Provider Note  Time seen: 7:56 PM  I have reviewed the triage vital signs and the nursing notes.   HISTORY  Chief Complaint Vaginal Discharge and Sore Throat    HPI Susan Shelton is a 25 y.o. female with a past medical history of migraines who presents the emergency department with sore throat, whiteness in her throat, and vaginal discharge. According to the patient she was diagnosed with uvulitis at the beginning of May, went through a course of antibiotics and steroids and states the swelling went down considerably. She states since that time she has continued to have some redness in the back of the throat but denies any sore throat. She is also had a more white appearing tongue. Patient also states for the last month or so she's been having some vaginal discharge denies being at risk for STDs. Denies any foul odor. Denies dysuria, abdominal pain, nausea, vomiting, fever. Patient does state she snores at night.Patient smokes cigarettes, states she has been under a lot of stress lately and smoking more than normal, she is concerned that she could have mouth cancer.     Past Medical History  Diagnosis Date  . Migraines   . Bacterial vaginosis     There are no active problems to display for this patient.   History reviewed. No pertinent past surgical history.  Current Outpatient Rx  Name  Route  Sig  Dispense  Refill  . butalbital-acetaminophen-caffeine (FIORICET) 50-325-40 MG tablet   Oral   Take 1-2 tablets by mouth every 6 (six) hours as needed for headache.   20 tablet   0   . cyclobenzaprine (FLEXERIL) 10 MG tablet   Oral   Take 1 tablet (10 mg total) by mouth every 8 (eight) hours as needed for muscle spasms.   15 tablet   0   . ibuprofen (ADVIL,MOTRIN) 800 MG tablet   Oral   Take 1 tablet (800 mg total) by mouth every 8 (eight) hours as needed for moderate pain.   15 tablet   0   . mupirocin ointment  (BACTROBAN) 2 %      Apply to affected area 3 times daily   22 g   0   . predniSONE (DELTASONE) 20 MG tablet   Oral   Take 3 tablets (60 mg total) by mouth daily.   12 tablet   0   . traMADol (ULTRAM) 50 MG tablet   Oral   Take 1 tablet (50 mg total) by mouth every 6 (six) hours as needed for moderate pain.   12 tablet   0     Allergies Doxycycline  No family history on file.  Social History Social History  Substance Use Topics  . Smoking status: Current Every Day Smoker -- 1.00 packs/day    Types: Cigarettes  . Smokeless tobacco: None  . Alcohol Use: No    Review of Systems Constitutional: Negative for fever Cardiovascular: Negative for chest pain. Respiratory: Negative for shortness of breath. Gastrointestinal: Negative for abdominal pain Genitourinary: Negative for dysuria. Positive for white discharge. Neurological: Negative for headache 10-point ROS otherwise negative.  ____________________________________________   PHYSICAL EXAM:  VITAL SIGNS: ED Triage Vitals  Enc Vitals Group     BP 11/24/15 1845 133/81 mmHg     Pulse Rate 11/24/15 1845 76     Resp 11/24/15 1845 18     Temp 11/24/15 1845 98.5 F (36.9 C)     Temp Source 11/24/15  1845 Oral     SpO2 11/24/15 1845 100 %     Weight 11/24/15 1845 175 lb (79.379 kg)     Height 11/24/15 1845 5\' 2"  (1.575 m)     Head Cir --      Peak Flow --      Pain Score 11/24/15 1845 2     Pain Loc --      Pain Edu? --      Excl. in GC? --     Constitutional: Alert and oriented. Well appearing and in no distress. Eyes: Normal exam ENT   Head: Normocephalic and atraumatic   Mouth/Throat: Mucous membranes are moist. Cardiovascular: Normal rate, regular rhythm. No murmur Respiratory: Normal respiratory effort without tachypnea nor retractions. Breath sounds are clear  Gastrointestinal: Soft and nontender. No distention Musculoskeletal: Nontender with normal range of motion in all extremities.   Neurologic:  Normal speech and language. No gross focal neurologic deficits Skin:  Skin is warm, dry and intact.  Psychiatric: Mood and affect are normal.   ____________________________________________   INITIAL IMPRESSION / ASSESSMENT AND PLAN / ED COURSE  Pertinent labs & imaging results that were available during my care of the patient were reviewed by me and considered in my medical decision making (see chart for details).  Patient presents the emergency department with multiple complaints including continued redness in her throat, vaginal discharge, and a white tongue. On oral examination patient appears well, no abnormalities identified, no obvious erythema, no exudates, uvula is normal in appearance. No signs of candida infection. I discussed with the patient brushing or scraping her tongue. Discussed using nasal strips to help reduce snoring as it could possibly be resulting in traumatic uvulitis although the uvula appears normal currently. Patient is having some vaginal discharge, perform a pelvic examination and send a wet prep. Patient states they were checked for STDs last month and she is not at risk, does not wish to be treated at this time. I discussed smoking cessation with the patient.  Pelvic exam shows minimal tenderness, mild white vaginal discharge.  Wet prep has resulted normal. STD testing pending, patient does not wish to be treated empirically.  ____________________________________________   FINAL CLINICAL IMPRESSION(S) / ED DIAGNOSES  Vaginal discharge Sore throat  Minna AntisKevin Alyssha Housh, MD 11/24/15 2019

## 2015-11-24 NOTE — Discharge Instructions (Signed)

## 2015-11-24 NOTE — ED Notes (Signed)
Pt. Verbalizes understanding of d/c instructions, and follow-up. VS stable.  Pt. In NAD at time of d/c and denies concerns. Pt. Ambulatory Out of the unit with steady gait

## 2016-01-23 ENCOUNTER — Emergency Department
Admission: EM | Admit: 2016-01-23 | Discharge: 2016-01-23 | Disposition: A | Discharge status: Home / Self Care | Payer: 59 | Attending: Emergency Medicine | Admitting: Emergency Medicine

## 2016-01-23 ENCOUNTER — Encounter: Payer: Self-pay | Admitting: Emergency Medicine

## 2016-01-23 ENCOUNTER — Emergency Department: Payer: 59

## 2016-01-23 DIAGNOSIS — F17210 Nicotine dependence, cigarettes, uncomplicated: Secondary | ICD-10-CM | POA: Diagnosis not present

## 2016-01-23 DIAGNOSIS — Y939 Activity, unspecified: Secondary | ICD-10-CM | POA: Diagnosis not present

## 2016-01-23 DIAGNOSIS — Y92149 Unspecified place in prison as the place of occurrence of the external cause: Secondary | ICD-10-CM | POA: Diagnosis not present

## 2016-01-23 DIAGNOSIS — Z043 Encounter for examination and observation following other accident: Secondary | ICD-10-CM | POA: Insufficient documentation

## 2016-01-23 DIAGNOSIS — Y999 Unspecified external cause status: Secondary | ICD-10-CM | POA: Insufficient documentation

## 2016-01-23 DIAGNOSIS — Z5321 Procedure and treatment not carried out due to patient leaving prior to being seen by health care provider: Secondary | ICD-10-CM | POA: Diagnosis not present
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
## 2016-02-23 ENCOUNTER — Emergency Department
Admission: EM | Admit: 2016-02-23 | Discharge: 2016-02-23 | Disposition: A | Discharge status: Home / Self Care | Payer: 59 | Attending: Student | Admitting: Student

## 2016-02-23 ENCOUNTER — Emergency Department: Payer: 59

## 2016-02-23 ENCOUNTER — Encounter: Payer: Self-pay | Admitting: Emergency Medicine

## 2016-02-23 DIAGNOSIS — Z791 Long term (current) use of non-steroidal anti-inflammatories (NSAID): Secondary | ICD-10-CM | POA: Insufficient documentation

## 2016-02-23 DIAGNOSIS — R05 Cough: Secondary | ICD-10-CM | POA: Diagnosis not present

## 2016-02-23 DIAGNOSIS — J069 Acute upper respiratory infection, unspecified: Secondary | ICD-10-CM | POA: Diagnosis not present

## 2016-02-23 DIAGNOSIS — F17210 Nicotine dependence, cigarettes, uncomplicated: Secondary | ICD-10-CM | POA: Insufficient documentation

## 2016-02-23 MED ORDER — CHLORPHENIRAMINE MALEATE 4 MG PO TABS: 4.0000 mg | ORAL_TABLET | Freq: Two times a day (BID) | ORAL | 0 refills | Status: DC | PRN

## 2016-02-23 MED ORDER — GUAIFENESIN-CODEINE 100-10 MG/5ML PO SOLN: 10.0000 mL | ORAL | 0 refills | Status: DC | PRN

## 2016-02-23 MED ORDER — AZITHROMYCIN 250 MG PO TABS: ORAL_TABLET | ORAL | 0 refills | Status: DC

## 2016-02-23 NOTE — ED Provider Notes (Signed)
[facility] Emergency Department Provider Note   ____________________________________________   None    (approximate)  I have reviewed the triage vital signs and the nursing notes.   HISTORY  Chief Complaint No chief complaint on file.  Sore throat and cough.  HPI Susan Shelton is a 25 y.o. female who presents with a three-day history of cough congestion runny nose drainage sore throat. Patient states denies any fever at home but has a productive yellowish-green sputum with her cough. He and a pack-a-day smoker  Past Medical History:  Diagnosis Date  . Bacterial vaginosis   . Migraines     There are no active problems to display for this patient.   History reviewed. No pertinent surgical history.  Prior to Admission medications   Medication Sig Start Date End Date Taking? Authorizing Provider  ibuprofen (ADVIL,MOTRIN) 800 MG tablet Take 1 tablet (800 mg total) by mouth every 8 (eight) hours as needed for moderate pain. 01/18/15  Yes Joni Reining, PA-C  azithromycin (ZITHROMAX Z-PAK) 250 MG tablet Take 2 tablets (500 mg) on  Day 1,  followed by 1 tablet (250 mg) once daily on Days 2 through 5. 02/23/16   Evangeline Dakin, PA-C  chlorpheniramine (CHLOR-TRIMETON) 4 MG tablet Take 1 tablet (4 mg total) by mouth 2 (two) times daily as needed for allergies or rhinitis. 02/23/16   Charmayne Sheer Leiyah Maultsby, PA-C  guaiFENesin-codeine 100-10 MG/5ML syrup Take 10 mLs by mouth every 4 (four) hours as needed for cough. 02/23/16   Evangeline Dakin, PA-C    Allergies Doxycycline  No family history on file.  Social History Social History  Substance Use Topics  . Smoking status: Current Every Day Smoker    Packs/day: 1.00    Types: Cigarettes  . Smokeless tobacco: Never Used  . Alcohol use No    Review of Systems Constitutional: No fever/chills Eyes: No visual changes. ENT: Positive sore throat. As of nasal congestion. Cardiovascular: Denies chest  pain. Respiratory: Denies shortness of breath.Positive for cough. Skin: Negative for rash. Neurological: Negative for headaches, focal weakness or numbness.  10-point ROS otherwise negative.  ____________________________________________   PHYSICAL EXAM:  VITAL SIGNS: ED Triage Vitals  Enc Vitals Group     BP 02/23/16 1042 136/81     Pulse Rate 02/23/16 1042 90     Resp 02/23/16 1042 18     Temp 02/23/16 1042 98 F (36.7 C)     Temp Source 02/23/16 1042 Oral     SpO2 02/23/16 1042 98 %     Weight 02/23/16 1043 195 lb (88.5 kg)     Height 02/23/16 1043 5\' 6"  (1.676 m)     Head Circumference --      Peak Flow --      Pain Score 02/23/16 1043 10     Pain Loc --      Pain Edu? --      Excl. in GC? --     Constitutional: Alert and oriented. Well appearing and in no acute distress.  HEENT: TMs unremarkable pharynx slightly erythematous nares no nasal turbinate congestion noted. Cardiovascular: Normal rate, regular rhythm. Grossly normal heart sounds.  Good peripheral circulation. Respiratory: Normal respiratory effort.  No retractions. Lungs Coarse breath sounds noted. Neurologic:  Normal speech and language. No gross focal neurologic deficits are appreciated.  Skin:  Skin is warm, dry and intact. No rash noted. Psychiatric: Mood and affect are normal. Speech and behavior are normal.  ____________________________________________  LABS (all labs ordered are listed, but only abnormal results are displayed)  Labs Reviewed - No data to display ____________________________________________  EKG   ____________________________________________  RADIOLOGY  No acute cardiopulmonary findings. ____________________________________________   PROCEDURES  Procedure(s) performed: None  Procedures  Critical Care performed: No  ____________________________________________   INITIAL IMPRESSION / ASSESSMENT AND PLAN / ED COURSE  Pertinent labs & imaging results that were  available during my care of the patient were reviewed by me and considered in my medical decision making (see chart for details).  Acute upper respiratory infection. Rx given for Zithromax, Robitussin-AC and chlorpheniramine. Patient to follow up PCP or return to the ER with any worsening symptomology. Work excuse 24 hours given.  Clinical Course     ____________________________________________   FINAL CLINICAL IMPRESSION(S) / ED DIAGNOSES  Final diagnoses:  URI, acute      NEW MEDICATIONS STARTED DURING THIS VISIT:  New Prescriptions   AZITHROMYCIN (ZITHROMAX Z-PAK) 250 MG TABLET    Take 2 tablets (500 mg) on  Day 1,  followed by 1 tablet (250 mg) once daily on Days 2 through 5.   CHLORPHENIRAMINE (CHLOR-TRIMETON) 4 MG TABLET    Take 1 tablet (4 mg total) by mouth 2 (two) times daily as needed for allergies or rhinitis.   GUAIFENESIN-CODEINE 100-10 MG/5ML SYRUP    Take 10 mLs by mouth every 4 (four) hours as needed for cough.     Note:  This document was prepared using Dragon voice recognition software and may include unintentional dictation errors.   Evangeline Dakinharles M Zorianna Taliaferro, PA-C 02/23/16 1334    Gayla DossEryka A Gayle, MD 02/23/16 [phone]

## 2016-02-23 NOTE — ED Triage Notes (Signed)
Lower back pain , spasms into legs

## 2016-02-23 NOTE — ED Notes (Signed)
Patient presents to ED today for c/o sore throat and cough since Monday.  No SOB/ DOE.  Denies any other complaint.

## 2016-03-09 ENCOUNTER — Encounter: Payer: Self-pay | Admitting: Emergency Medicine

## 2016-03-09 ENCOUNTER — Emergency Department
Admission: EM | Admit: 2016-03-09 | Discharge: 2016-03-09 | Disposition: A | Discharge status: Home / Self Care | Payer: 59 | Attending: Emergency Medicine | Admitting: Emergency Medicine

## 2016-03-09 DIAGNOSIS — F17210 Nicotine dependence, cigarettes, uncomplicated: Secondary | ICD-10-CM | POA: Diagnosis not present

## 2016-03-09 DIAGNOSIS — B9689 Other specified bacterial agents as the cause of diseases classified elsewhere: Secondary | ICD-10-CM

## 2016-03-09 DIAGNOSIS — N760 Acute vaginitis: Secondary | ICD-10-CM | POA: Diagnosis not present

## 2016-03-09 DIAGNOSIS — N898 Other specified noninflammatory disorders of vagina: Secondary | ICD-10-CM | POA: Diagnosis present

## 2016-03-09 DIAGNOSIS — Z791 Long term (current) use of non-steroidal anti-inflammatories (NSAID): Secondary | ICD-10-CM | POA: Diagnosis not present

## 2016-03-09 LAB — URINALYSIS COMPLETE WITH MICROSCOPIC (ARMC ONLY)
BILIRUBIN URINE: NEGATIVE
Bacteria, UA: NONE SEEN
Glucose, UA: NEGATIVE mg/dL
Hgb urine dipstick: NEGATIVE
KETONES UR: NEGATIVE mg/dL
Leukocytes, UA: NEGATIVE
Nitrite: NEGATIVE
Protein, ur: NEGATIVE mg/dL
Specific Gravity, Urine: 1.028 (ref 1.005–1.030)
pH: 6 (ref 5.0–8.0)

## 2016-03-09 LAB — WET PREP, GENITAL
SPERM: NONE SEEN
TRICH WET PREP: NONE SEEN
Yeast Wet Prep HPF POC: NONE SEEN

## 2016-03-09 LAB — POCT PREGNANCY, URINE: Preg Test, Ur: NEGATIVE

## 2016-03-09 LAB — CHLAMYDIA/NGC RT PCR (ARMC ONLY)
Chlamydia Tr: NOT DETECTED
N gonorrhoeae: NOT DETECTED

## 2016-03-09 MED ORDER — METRONIDAZOLE 0.75 % VA GEL: 1.0000 | Freq: Every day | VAGINAL | 0 refills | Status: AC

## 2016-03-09 MED ORDER — FLUCONAZOLE 150 MG PO TABS: 150.0000 mg | ORAL_TABLET | ORAL | 0 refills | Status: AC

## 2016-03-09 NOTE — ED Provider Notes (Signed)
[facility] Emergency Department Provider Note  ____________________________________________  Time seen: Approximately 6:45 PM  I have reviewed the triage vital signs and the nursing notes.   HISTORY  Chief Complaint Vaginal Itching    HPI Susan Shelton is a 25 y.o. female , NAD, presents to the emergency department with 2 day history of intermittent vaginal burning and irritation. Patient states she changed soaps 2 days ago and notes that she has vaginal burning and irritation after using the soap. Has not noted any vaginal discharge or pelvic pain. Does state she had a sexual encounter recently which the condom broke 6 she is concerned about such. Denies any dysuria, hematuria, increased urinary frequency, urinary urgency or hesitancy. Has not had any abdominal pain, nausea, vomiting. Denies chest pain, shortness of breath, fevers, chills or body aches. Has had no scalp paresthesias. Has not noted any rashes or skin sores.   Past Medical History:  Diagnosis Date  . Bacterial vaginosis   . Migraines     There are no active problems to display for this patient.   History reviewed. No pertinent surgical history.  Prior to Admission medications   Medication Sig Start Date End Date Taking? Authorizing Provider  azithromycin (ZITHROMAX Z-PAK) 250 MG tablet Take 2 tablets (500 mg) on  Day 1,  followed by 1 tablet (250 mg) once daily on Days 2 through 5. 02/23/16   Evangeline Dakin, PA-C  chlorpheniramine (CHLOR-TRIMETON) 4 MG tablet Take 1 tablet (4 mg total) by mouth 2 (two) times daily as needed for allergies or rhinitis. 02/23/16   Charmayne Sheer Beers, PA-C  fluconazole (DIFLUCAN) 150 MG tablet Take 1 tablet (150 mg total) by mouth as directed. Tale one tablet today then another in 3 days 03/09/16 03/16/16  Jami L Hagler, PA-C  guaiFENesin-codeine 100-10 MG/5ML syrup Take 10 mLs by mouth every 4 (four) hours as needed for cough. 02/23/16   Charmayne Sheer Beers, PA-C   ibuprofen (ADVIL,MOTRIN) 800 MG tablet Take 1 tablet (800 mg total) by mouth every 8 (eight) hours as needed for moderate pain. 01/18/15   Joni Reining, PA-C  metroNIDAZOLE (METROGEL VAGINAL) 0.75 % vaginal gel Place 1 Applicatorful vaginally at bedtime. 03/09/16 03/16/16  Jami L Hagler, PA-C    Allergies Doxycycline  No family history on file.  Social History Social History  Substance Use Topics  . Smoking status: Current Every Day Smoker    Packs/day: 1.00    Types: Cigarettes  . Smokeless tobacco: Never Used  . Alcohol use No     Review of Systems  Constitutional: No fever/chills Cardiovascular: No chest pain. Respiratory:No shortness of breath.  Gastrointestinal: No abdominal pain.  No nausea, vomiting.   Genitourinary: Positive vaginal discharge, irritation and itching. Negative for dysuria, hematuria. No urinary hesitancy, urgency or increased frequency. Musculoskeletal: Negative for back pain.  Skin: Negative for rash. Neurological: Negative for saddle paresthesias. 10-point ROS otherwise negative.  ____________________________________________   PHYSICAL EXAM:  VITAL SIGNS: ED Triage Vitals  Enc Vitals Group     BP 03/09/16 1713 138/76     Pulse Rate 03/09/16 1713 81     Resp 03/09/16 1713 20     Temp 03/09/16 1713 98.6 F (37 C)     Temp Source 03/09/16 1713 Oral     SpO2 03/09/16 1713 100 %     Weight 03/09/16 1714 171 lb (77.6 kg)     Height 03/09/16 1714 5\' 2"  (1.575 m)     Head  Circumference --      Peak Flow --      Pain Score --      Pain Loc --      Pain Edu? --      Excl. in GC? --     Physical exam completed in the presence of Leona Carryrystal Baine, RN  Constitutional: Alert and oriented. Well appearing and in no acute distress. Eyes: Conjunctivae are normal Head: Atraumatic. Hematological/Lymphatic/Immunilogical: No inguinal lymphadenopathy. Cardiovascular: Good peripheral circulation. Respiratory: Normal respiratory effort without tachypnea  or retractions.  Genitourinary: External genitalia without abnormality. White discharge noted about the vaginal vault. No CMT. Musculoskeletal: Full range of motion of bilateral upper and lower extremities without pain or difficulty. Neurologic:  Normal speech and language. No gross focal neurologic deficits are appreciated.  Skin:  Skin is warm, dry and intact. No rash or skin sores noted. Psychiatric: Mood and affect are normal. Speech and behavior are normal. Patient exhibits appropriate insight and judgement.   ____________________________________________   LABS (all labs ordered are listed, but only abnormal results are displayed)  Labs Reviewed  WET PREP, GENITAL - Abnormal; Notable for the following:       Result Value   Clue Cells Wet Prep HPF POC PRESENT (*)    WBC, Wet Prep HPF POC MODERATE (*)    All other components within normal limits  URINALYSIS COMPLETEWITH MICROSCOPIC ([facility] ONLY) - Abnormal; Notable for the following:    Color, Urine YELLOW (*)    APPearance CLEAR (*)    Squamous Epithelial / LPF 0-5 (*)    All other components within normal limits  CHLAMYDIA/NGC RT PCR ([facility] ONLY)  POC URINE PREG, ED  POCT PREGNANCY, URINE   ____________________________________________  EKG  None ____________________________________________  RADIOLOGY  None ____________________________________________    PROCEDURES  Procedure(s) performed: None   Procedures   Medications - No data to display   ____________________________________________   INITIAL IMPRESSION / ASSESSMENT AND PLAN / ED COURSE  Pertinent labs & imaging results that were available during my care of the patient were reviewed by me and considered in my medical decision making (see chart for details).  Clinical Course    Patient's diagnosis is consistent with Bacterial vaginosis. Cultures for gonorrhea and chlamydia was sent to the lab. Patient will be contacted with those results  when available. Patient will be discharged home with prescriptions for fluconazole and MetroGel to use as directed. Patient is advised to no longer use her current soap. [facility] or her primary care provider if symptoms persist past this treatment course. Patient is given ED precautions to return to the ED for any worsening or new symptoms.    ____________________________________________  FINAL CLINICAL IMPRESSION(S) / ED DIAGNOSES  Final diagnoses:  Bacterial vaginosis      NEW MEDICATIONS STARTED DURING THIS VISIT:  New Prescriptions   FLUCONAZOLE (DIFLUCAN) 150 MG TABLET    Take 1 tablet (150 mg total) by mouth as directed. Tale one tablet today then another in 3 days   METRONIDAZOLE (METROGEL VAGINAL) 0.75 % VAGINAL GEL    Place 1 Applicatorful vaginally at bedtime.         Hope PigeonJami L Hagler, PA-C 03/09/16 1924    Nita Sicklearolina Veronese, MD 03/11/16 1041

## 2016-03-09 NOTE — ED Triage Notes (Signed)
States she is having some vaginal burning and irritation  Denies any urinary sx's or discharge

## 2016-03-14 MED FILL — metroNIDAZOLE 0.75 % GEL: 0.75 | 7 days supply | Qty: 70 | Fill #0

## 2016-03-14 MED FILL — FLUCONAZOLE 150 MG TABLET: 150 | 3 days supply | Qty: 2 | Fill #0

## 2016-04-29 LAB — HM PAP SMEAR: HM Pap smear: NEGATIVE

## 2016-11-04 ENCOUNTER — Encounter: Payer: Self-pay | Admitting: Emergency Medicine

## 2016-11-04 ENCOUNTER — Emergency Department: Payer: Self-pay

## 2016-11-04 DIAGNOSIS — R062 Wheezing: Secondary | ICD-10-CM | POA: Diagnosis not present

## 2016-11-04 DIAGNOSIS — Z791 Long term (current) use of non-steroidal anti-inflammatories (NSAID): Secondary | ICD-10-CM | POA: Insufficient documentation

## 2016-11-04 DIAGNOSIS — Z79899 Other long term (current) drug therapy: Secondary | ICD-10-CM | POA: Insufficient documentation

## 2016-11-04 DIAGNOSIS — H6992 Unspecified Eustachian tube disorder, left ear: Secondary | ICD-10-CM | POA: Diagnosis not present

## 2016-11-04 DIAGNOSIS — R05 Cough: Secondary | ICD-10-CM | POA: Diagnosis not present

## 2016-11-04 DIAGNOSIS — J069 Acute upper respiratory infection, unspecified: Secondary | ICD-10-CM | POA: Diagnosis not present

## 2016-11-04 DIAGNOSIS — F17210 Nicotine dependence, cigarettes, uncomplicated: Secondary | ICD-10-CM | POA: Insufficient documentation

## 2016-11-04 MED ORDER — ACETAMINOPHEN 500 MG PO TABS
ORAL_TABLET | ORAL | Status: AC
  Filled 2016-11-04: qty 2

## 2016-11-04 MED ORDER — ALBUTEROL SULFATE (2.5 MG/3ML) 0.083% IN NEBU
5.0000 mg | INHALATION_SOLUTION | Freq: Once | RESPIRATORY_TRACT | Status: AC
  Administered 2016-11-04: 5 mg via RESPIRATORY_TRACT
  Filled 2016-11-04: qty 6

## 2016-11-04 MED ORDER — ACETAMINOPHEN 500 MG PO TABS
1000.0000 mg | ORAL_TABLET | Freq: Once | ORAL | Status: AC
  Administered 2016-11-04: 1000 mg via ORAL

## 2016-11-04 NOTE — ED Triage Notes (Signed)
Pt ambulatory to triage with steady gait, no distress noted. Pt c/o nasal congestion, cough and left ear pain x1 week. No relief with home medication. Fever of 100.47F in triage.

## 2016-11-05 ENCOUNTER — Emergency Department
Admission: EM | Admit: 2016-11-05 | Discharge: 2016-11-05 | Disposition: A | Discharge status: Home / Self Care | Payer: Self-pay | Attending: Emergency Medicine | Admitting: Emergency Medicine

## 2016-11-05 DIAGNOSIS — J069 Acute upper respiratory infection, unspecified: Secondary | ICD-10-CM

## 2016-11-05 DIAGNOSIS — R062 Wheezing: Secondary | ICD-10-CM

## 2016-11-05 DIAGNOSIS — B9789 Other viral agents as the cause of diseases classified elsewhere: Secondary | ICD-10-CM

## 2016-11-05 DIAGNOSIS — H6982 Other specified disorders of Eustachian tube, left ear: Secondary | ICD-10-CM

## 2016-11-05 MED ORDER — HYDROCOD POLST-CPM POLST ER 10-8 MG/5ML PO SUER
5.0000 mL | Freq: Once | ORAL | Status: AC
  Administered 2016-11-05: 5 mL via ORAL
  Filled 2016-11-05: qty 5

## 2016-11-05 MED ORDER — HYDROCOD POLST-CPM POLST ER 10-8 MG/5ML PO SUER: 5.0000 mL | Freq: Two times a day (BID) | ORAL | 0 refills | Status: DC

## 2016-11-05 MED ORDER — METHYLPREDNISOLONE 4 MG PO TBPK: ORAL_TABLET | ORAL | 0 refills | Status: DC

## 2016-11-05 MED ORDER — ALBUTEROL SULFATE HFA 108 (90 BASE) MCG/ACT IN AERS: 2.0000 | INHALATION_SPRAY | RESPIRATORY_TRACT | 0 refills | Status: DC | PRN

## 2016-11-05 MED ORDER — PREDNISONE 20 MG PO TABS
30.0000 mg | ORAL_TABLET | Freq: Once | ORAL | Status: AC
  Administered 2016-11-05: 30 mg via ORAL
  Filled 2016-11-05: qty 1

## 2016-11-05 NOTE — ED Provider Notes (Signed)
[facility] Emergency Department Provider Note   ____________________________________________   First MD Initiated Contact with Patient 11/05/16 0131     (approximate)  I have reviewed the triage vital signs and the nursing notes.   HISTORY  Chief Complaint Cough; Nasal Congestion; and Ear Fullness    HPI Susan Shelton is a 26 y.o. female who presents to the ED from home with a chief complaint of cold-like symptoms. Patient reports a 4 to five-day history of nasal congestion, nonproductive cough, left ear pain and low-grade fever. Denies associated chest pain, shortness of breath, abdominal pain, nausea, vomiting, dysuria, diarrhea. Denies recent travel or trauma. Nothing makes her symptoms better or worse.   Past Medical History:  Diagnosis Date  . Bacterial vaginosis   . Migraines     There are no active problems to display for this patient.   History reviewed. No pertinent surgical history.  Prior to Admission medications   Medication Sig Start Date End Date Taking? Authorizing Provider  albuterol (PROVENTIL HFA;VENTOLIN HFA) 108 (90 Base) MCG/ACT inhaler Inhale 2 puffs into the lungs every 4 (four) hours as needed for wheezing or shortness of breath. 11/05/16   Irean Hong, MD  azithromycin (ZITHROMAX Z-PAK) 250 MG tablet Take 2 tablets (500 mg) on  Day 1,  followed by 1 tablet (250 mg) once daily on Days 2 through 5. 02/23/16   Beers, Charmayne Sheer, PA-C  chlorpheniramine (CHLOR-TRIMETON) 4 MG tablet Take 1 tablet (4 mg total) by mouth 2 (two) times daily as needed for allergies or rhinitis. 02/23/16   Beers, Charmayne Sheer, PA-C  chlorpheniramine-HYDROcodone (TUSSIONEX PENNKINETIC ER) 10-8 MG/5ML SUER Take 5 mLs by mouth 2 (two) times daily. 11/05/16   Irean Hong, MD  guaiFENesin-codeine 100-10 MG/5ML syrup Take 10 mLs by mouth every 4 (four) hours as needed for cough. 02/23/16   Beers, Charmayne Sheer, PA-C  ibuprofen (ADVIL,MOTRIN) 800 MG tablet Take 1  tablet (800 mg total) by mouth every 8 (eight) hours as needed for moderate pain. 01/18/15   Joni Reining, PA-C  methylPREDNISolone (MEDROL DOSEPAK) 4 MG TBPK tablet Take as directed 11/05/16   Irean Hong, MD    Allergies Doxycycline  History reviewed. No pertinent family history.  Social History Social History  Substance Use Topics  . Smoking status: Current Every Day Smoker    Packs/day: 1.00    Types: Cigarettes  . Smokeless tobacco: Never Used  . Alcohol use No    Review of Systems  Constitutional: Positive for subjective fever. Eyes: No visual changes. ENT: Positive for nasal congestion and left ear pain. Negative for sore throat. Cardiovascular: Denies chest pain. Respiratory: Positive for nonproductive cough. Denies shortness of breath. Gastrointestinal: No abdominal pain.  No nausea, no vomiting.  No diarrhea.  No constipation. Genitourinary: Negative for dysuria. Musculoskeletal: Negative for back pain. Skin: Negative for rash. Neurological: Negative for headaches, focal weakness or numbness.   ____________________________________________   PHYSICAL EXAM:  VITAL SIGNS: ED Triage Vitals [11/04/16 2300]  Enc Vitals Group     BP 134/70     Pulse Rate 100     Resp 18     Temp 100.1 F (37.8 C)     Temp Source Oral     SpO2 100 %     Weight      Height      Head Circumference      Peak Flow      Pain Score  Pain Loc      Pain Edu?      Excl. in GC?     Constitutional: Alert and oriented. Well appearing and in no acute distress. Head: Atraumatic. Ears: Right TM within normal limits. Left TM nonerythematous but bulging with fluid. No perforation. Nose: Congestion/rhinnorhea. Mouth/Throat: Mucous membranes are moist.  Oropharynx mildly erythematous without tonsillar swelling, exudates or peritonsillar abscess. There is no hoarse or muffled voice. There is no drooling. Neck: No stridor.   Cardiovascular: Normal rate, regular rhythm. Grossly  normal heart sounds.  Good peripheral circulation. Respiratory: Normal respiratory effort.  No retractions. Lungs with faint wheezing which clears with respiration. Gastrointestinal: Soft and nontender. No distention. No abdominal bruits. No CVA tenderness. Musculoskeletal: No lower extremity tenderness nor edema.  No joint effusions. Neurologic:  Normal speech and language. No gross focal neurologic deficits are appreciated. No gait instability. Skin:  Skin is warm, dry and intact. No rash noted. No petechiae. Psychiatric: Mood and affect are normal. Speech and behavior are normal.  ____________________________________________   LABS (all labs ordered are listed, but only abnormal results are displayed)  Labs Reviewed - No data to display ____________________________________________  EKG  None ____________________________________________  RADIOLOGY  Dg Chest 2 View  Result Date: 11/04/2016 CLINICAL DATA:  Acute onset of shortness of breath. Initial encounter. EXAM: CHEST  2 VIEW COMPARISON:  Chest radiograph performed 02/23/2016 FINDINGS: The lungs are well-aerated and clear. There is no evidence of focal opacification, pleural effusion or pneumothorax. The heart is normal in size; the mediastinal contour is within normal limits. No acute osseous abnormalities are seen. IMPRESSION: No acute cardiopulmonary process seen. Electronically Signed   By: Roanna RaiderJeffery  Chang M.D.   On: 11/04/2016 23:42    ____________________________________________   PROCEDURES  Procedure(s) performed: None  Procedures  Critical Care performed: No  ____________________________________________   INITIAL IMPRESSION / ASSESSMENT AND PLAN / ED COURSE  Pertinent labs & imaging results that were available during my care of the patient were reviewed by me and considered in my medical decision making (see chart for details).  26 year old female who presents with viral URI. She was given albuterol  nebulizer prior to my examination with improvement in wheezing. Will place her on steroid taper, Tussionex as needed, albuterol inhaler as needed and she will follow closely with her PCP. Strict return precautions given. Patient verbalizes understanding and agrees with plan of care.      ____________________________________________   FINAL CLINICAL IMPRESSION(S) / ED DIAGNOSES  Final diagnoses:  Viral URI with cough  Wheezing  Eustachian tube dysfunction, left      NEW MEDICATIONS STARTED DURING THIS VISIT:  New Prescriptions   ALBUTEROL (PROVENTIL HFA;VENTOLIN HFA) 108 (90 BASE) MCG/ACT INHALER    Inhale 2 puffs into the lungs every 4 (four) hours as needed for wheezing or shortness of breath.   CHLORPHENIRAMINE-HYDROCODONE (TUSSIONEX PENNKINETIC ER) 10-8 MG/5ML SUER    Take 5 mLs by mouth 2 (two) times daily.   METHYLPREDNISOLONE (MEDROL DOSEPAK) 4 MG TBPK TABLET    Take as directed     Note:  This document was prepared using Dragon voice recognition software and may include unintentional dictation errors.    Irean HongSung, Jadin Kagel J, MD 11/05/16 [phone]

## 2016-11-05 NOTE — ED Notes (Signed)
Pt uprite on stretcher in exam room, eyes closed, awakens easily with no distress noted; reports x 4 days having left ear pain, nasal congestion and prod cough green sputum; taking benadryl at home without relief; resp even/unlab, lungs clear

## 2016-11-05 NOTE — Discharge Instructions (Signed)
1. Take steroid taper as prescribed (Medrol Dosepak). 2. Use albuterol inhaler 2 puffs every 4 hours as needed for wheezing. 3. Take cough medicine as needed (Tussionex). 4. Return to the ER for worsening symptoms, persistent vomiting, difficulty breathing or other concerns.

## 2017-09-29 ENCOUNTER — Encounter: Payer: Self-pay | Admitting: Emergency Medicine

## 2017-09-29 ENCOUNTER — Emergency Department
Admission: EM | Admit: 2017-09-29 | Discharge: 2017-09-29 | Disposition: A | Discharge status: Home / Self Care | Payer: Self-pay | Attending: Emergency Medicine | Admitting: Emergency Medicine

## 2017-09-29 ENCOUNTER — Other Ambulatory Visit: Payer: Self-pay

## 2017-09-29 DIAGNOSIS — F17210 Nicotine dependence, cigarettes, uncomplicated: Secondary | ICD-10-CM | POA: Insufficient documentation

## 2017-09-29 DIAGNOSIS — Z79899 Other long term (current) drug therapy: Secondary | ICD-10-CM | POA: Insufficient documentation

## 2017-09-29 DIAGNOSIS — M542 Cervicalgia: Secondary | ICD-10-CM | POA: Insufficient documentation

## 2017-09-29 MED ORDER — KETOROLAC TROMETHAMINE 30 MG/ML IJ SOLN
30.0000 mg | Freq: Once | INTRAMUSCULAR | Status: AC
Start: 2017-09-29 — End: 2017-09-29
  Administered 2017-09-29: 30 mg via INTRAMUSCULAR
  Filled 2017-09-29: qty 1

## 2017-09-29 MED ORDER — OXYCODONE-ACETAMINOPHEN 5-325 MG PO TABS
1.0000 | ORAL_TABLET | ORAL | Status: DC | PRN
  Administered 2017-09-29: 1 via ORAL
  Filled 2017-09-29: qty 1

## 2017-09-29 MED ORDER — CYCLOBENZAPRINE HCL 5 MG PO TABS: ORAL_TABLET | ORAL | 0 refills | Status: DC

## 2017-09-29 MED ORDER — KETOROLAC TROMETHAMINE 10 MG PO TABS: 10.0000 mg | ORAL_TABLET | Freq: Four times a day (QID) | ORAL | 0 refills | Status: DC | PRN

## 2017-09-29 NOTE — ED Triage Notes (Signed)
Pt reports she rolled over in her sleep and hurt her neck. Pt c.o pain to look left and right.

## 2017-09-29 NOTE — ED Provider Notes (Signed)
[facility] Emergency Department Provider Note  ____________________________________________  Time seen: Approximately 2:37 PM  I have reviewed the triage vital signs and the nursing notes.   HISTORY  Chief Complaint Torticollis    HPI FLORNCE RECORD is a 27 y.o. female presents emergency department for evaluation of right-sided neck pain since this morning.  Patient rolled over in bed and felt a pull.   Pain is worse when turning head to the left.  Pain extends from the left side of her neck down into her shoulder.  She is not having any pain in the back of her neck.  No numbness, tingling.   Past Medical History:  Diagnosis Date  . Bacterial vaginosis   . Migraines     There are no active problems to display for this patient.   History reviewed. No pertinent surgical history.  Prior to Admission medications   Medication Sig Start Date End Date Taking? Authorizing Provider  albuterol (PROVENTIL HFA;VENTOLIN HFA) 108 (90 Base) MCG/ACT inhaler Inhale 2 puffs into the lungs every 4 (four) hours as needed for wheezing or shortness of breath. 11/05/16   Irean Hong, MD  azithromycin (ZITHROMAX Z-PAK) 250 MG tablet Take 2 tablets (500 mg) on  Day 1,  followed by 1 tablet (250 mg) once daily on Days 2 through 5. 02/23/16   Beers, Charmayne Sheer, PA-C  chlorpheniramine (CHLOR-TRIMETON) 4 MG tablet Take 1 tablet (4 mg total) by mouth 2 (two) times daily as needed for allergies or rhinitis. 02/23/16   Beers, Charmayne Sheer, PA-C  chlorpheniramine-HYDROcodone (TUSSIONEX PENNKINETIC ER) 10-8 MG/5ML SUER Take 5 mLs by mouth 2 (two) times daily. 11/05/16   Irean Hong, MD  cyclobenzaprine (FLEXERIL) 5 MG tablet Take 1-2 tablets 3 times daily as needed 09/29/17   Enid Derry, PA-C  guaiFENesin-codeine 100-10 MG/5ML syrup Take 10 mLs by mouth every 4 (four) hours as needed for cough. 02/23/16   Beers, Charmayne Sheer, PA-C  ibuprofen (ADVIL,MOTRIN) 800 MG tablet Take 1 tablet (800 mg  total) by mouth every 8 (eight) hours as needed for moderate pain. 01/18/15   Joni Reining, PA-C  ketorolac (TORADOL) 10 MG tablet Take 1 tablet (10 mg total) by mouth every 6 (six) hours as needed. 09/29/17   Enid Derry, PA-C  methylPREDNISolone (MEDROL DOSEPAK) 4 MG TBPK tablet Take as directed 11/05/16   Irean Hong, MD    Allergies Doxycycline  No family history on file.  Social History Social History   Tobacco Use  . Smoking status: Current Every Day Smoker    Packs/day: 1.00    Types: Cigarettes  . Smokeless tobacco: Never Used  Substance Use Topics  . Alcohol use: No  . Drug use: Not on file     Review of Systems  Constitutional: No fever/chills Cardiovascular: No chest pain. Respiratory: No SOB. Gastrointestinal: No abdominal pain.  No nausea, no vomiting.  Musculoskeletal: Positive for neck pain. Skin: Negative for rash, abrasions, lacerations, ecchymosis. Neurological: Negative for headaches, numbness or tingling   ____________________________________________   PHYSICAL EXAM:  VITAL SIGNS: ED Triage Vitals  Enc Vitals Group     BP 09/29/17 1240 135/84     Pulse Rate 09/29/17 1240 76     Resp 09/29/17 1240 20     Temp 09/29/17 1240 98.6 F (37 C)     Temp Source 09/29/17 1240 Oral     SpO2 09/29/17 1240 99 %     Weight 09/29/17 1237 185 lb (83.9  kg)     Height 09/29/17 1237  (1.575 m)     Head Circumference --      Peak Flow --      Pain Score 09/29/17 1237 9     Pain Loc --      Pain Edu? --      Excl. in GC? --      Constitutional: Alert and oriented. Well appearing and in no acute distress. Eyes: Conjunctivae are normal. PERRL. EOMI. Head: Atraumatic. ENT:      Ears:      Nose: No congestion/rhinnorhea.      Mouth/Throat: Mucous membranes are moist.  Neck: No stridor. No cervical spine tenderness to palpation. Tenderness to palpation over right trapezius muscle. Pain elicited with left rotation. No pain with extension and flexion  of neck.  Cardiovascular: Normal rate, regular rhythm.  Good peripheral circulation. Respiratory: Normal respiratory effort without tachypnea or retractions. Lungs CTAB. Good air entry to the bases with no decreased or absent breath sounds. Musculoskeletal: Full range of motion to all extremities. No gross deformities appreciated. Neurologic:  Normal speech and language. No gross focal neurologic deficits are appreciated.  Skin:  Skin is warm, dry and intact. No rash noted. Psychiatric: Mood and affect are normal. Speech and behavior are normal. Patient exhibits appropriate insight and judgement.   ____________________________________________   LABS (all labs ordered are listed, but only abnormal results are displayed)  Labs Reviewed - No data to display ____________________________________________  EKG   ____________________________________________  RADIOLOGY   No results found.  ____________________________________________    PROCEDURES  Procedure(s) performed:    Procedures    Medications  oxyCODONE-acetaminophen (PERCOCET/ROXICET) 5-325 MG per tablet 1 tablet (1 tablet Oral Given 09/29/17 1421)  ketorolac (TORADOL) 30 MG/ML injection 30 mg (30 mg Intramuscular Given 09/29/17 1448)     ____________________________________________   INITIAL IMPRESSION / ASSESSMENT AND PLAN / ED COURSE  Pertinent labs & imaging results that were available during my care of the patient were reviewed by me and considered in my medical decision making (see chart for details).  Review of the [place] CSRS was performed in accordance of the [facility] prior to dispensing any controlled drugs.  Patient presented to the emergency department for evaluation of neck pain for 1 day.  Vital signs and exam are reassuring. No trauma or cervical spine tenderness. No radiculopathy.  Patient will be discharged home with prescriptions for Toradol and Flexeril. Patient is to follow up with PCP as directed.  Patient is given ED precautions to return to the ED for any worsening or new symptoms.     ____________________________________________  FINAL CLINICAL IMPRESSION(S) / ED DIAGNOSES  Final diagnoses:  Neck pain      NEW MEDICATIONS STARTED DURING THIS VISIT:  ED Discharge Orders        Ordered    cyclobenzaprine (FLEXERIL) 5 MG tablet     09/29/17 1447    ketorolac (TORADOL) 10 MG tablet  Every 6 hours PRN     09/29/17 1447          This chart was dictated using voice recognition software/Dragon. Despite best efforts to proofread, errors can occur which can change the meaning. Any change was purely unintentional.    Enid Derry, PA-C 09/29/17 1539    Merrily Brittle, MD 09/29/17 [phone]

## 2017-09-29 NOTE — ED Notes (Signed)
See triage note  Presents with pain to neck  Denies any specific trauma  But states pain started after rolling over in bed   Increased pain with movement

## 2018-02-03 LAB — HM HIV SCREENING LAB: HM HIV Screening: NEGATIVE

## 2018-03-27 ENCOUNTER — Emergency Department
Admission: EM | Admit: 2018-03-27 | Discharge: 2018-03-27 | Disposition: A | Discharge status: Home / Self Care | Payer: Self-pay | Attending: Emergency Medicine | Admitting: Emergency Medicine

## 2018-03-27 ENCOUNTER — Other Ambulatory Visit: Payer: Self-pay

## 2018-03-27 DIAGNOSIS — L0291 Cutaneous abscess, unspecified: Secondary | ICD-10-CM

## 2018-03-27 DIAGNOSIS — L02214 Cutaneous abscess of groin: Secondary | ICD-10-CM | POA: Insufficient documentation

## 2018-03-27 DIAGNOSIS — F17210 Nicotine dependence, cigarettes, uncomplicated: Secondary | ICD-10-CM | POA: Insufficient documentation

## 2018-03-27 DIAGNOSIS — Z79899 Other long term (current) drug therapy: Secondary | ICD-10-CM | POA: Insufficient documentation

## 2018-03-27 MED ORDER — NAPROXEN 500 MG PO TABS: 500.0000 mg | ORAL_TABLET | Freq: Two times a day (BID) | ORAL | Status: DC

## 2018-03-27 MED ORDER — SULFAMETHOXAZOLE-TRIMETHOPRIM 800-160 MG PO TABS: 1.0000 | ORAL_TABLET | Freq: Two times a day (BID) | ORAL | 0 refills | Status: DC

## 2018-03-27 NOTE — ED Triage Notes (Signed)
Abscess to right groin x 3 days, swelling and tenderness.   Pt alert and oriented X4, active, cooperative, pt in NAD. RR even and unlabored, color WNL.

## 2018-03-27 NOTE — Discharge Instructions (Signed)
Follow discharge care instruction take medication as directed.  Avoid shaving the area for 2 weeks.

## 2018-03-27 NOTE — ED Notes (Signed)
See triage note Presents with pain and some swelling to right groin area

## 2018-03-27 NOTE — ED Provider Notes (Signed)
[facility] Emergency Department Provider Note   ____________________________________________   First MD Initiated Contact with Patient 03/27/18 [phone]     (approximate)  I have reviewed the triage vital signs and the nursing notes.   HISTORY  Chief Complaint Abscess    HPI Susan Shelton is a 27 y.o. female patient presents with pain and nausea lesion to the right groin area for 3 days.  Patient state notes discomfort for 1 week after shaving area.  Patient denies fevers chills associated with this complaint.  Patient denies drainage from the area.  Patient rates pain as a 7/10.  Patient described the pain is "aching".  No palliative measures for complaint.   Past Medical History:  Diagnosis Date  . Bacterial vaginosis   . Migraines     There are no active problems to display for this patient.   History reviewed. No pertinent surgical history.  Prior to Admission medications   Medication Sig Start Date End Date Taking? Authorizing Provider  albuterol (PROVENTIL HFA;VENTOLIN HFA) 108 (90 Base) MCG/ACT inhaler Inhale 2 puffs into the lungs every 4 (four) hours as needed for wheezing or shortness of breath. 11/05/16   Irean Hong, MD  azithromycin (ZITHROMAX Z-PAK) 250 MG tablet Take 2 tablets (500 mg) on  Day 1,  followed by 1 tablet (250 mg) once daily on Days 2 through 5. 02/23/16   Beers, Charmayne Sheer, PA-C  chlorpheniramine (CHLOR-TRIMETON) 4 MG tablet Take 1 tablet (4 mg total) by mouth 2 (two) times daily as needed for allergies or rhinitis. 02/23/16   Beers, Charmayne Sheer, PA-C  chlorpheniramine-HYDROcodone (TUSSIONEX PENNKINETIC ER) 10-8 MG/5ML SUER Take 5 mLs by mouth 2 (two) times daily. 11/05/16   Irean Hong, MD  cyclobenzaprine (FLEXERIL) 5 MG tablet Take 1-2 tablets 3 times daily as needed 09/29/17   Enid Derry, PA-C  guaiFENesin-codeine 100-10 MG/5ML syrup Take 10 mLs by mouth every 4 (four) hours as needed for cough. 02/23/16   Beers, Charmayne Sheer,  PA-C  ibuprofen (ADVIL,MOTRIN) 800 MG tablet Take 1 tablet (800 mg total) by mouth every 8 (eight) hours as needed for moderate pain. 01/18/15   Joni Reining, PA-C  ketorolac (TORADOL) 10 MG tablet Take 1 tablet (10 mg total) by mouth every 6 (six) hours as needed. 09/29/17   Enid Derry, PA-C  methylPREDNISolone (MEDROL DOSEPAK) 4 MG TBPK tablet Take as directed 11/05/16   Irean Hong, MD  naproxen (NAPROSYN) 500 MG tablet Take 1 tablet (500 mg total) by mouth 2 (two) times daily with a meal. 03/27/18   Joni Reining, PA-C  sulfamethoxazole-trimethoprim (BACTRIM DS,SEPTRA DS) 800-160 MG tablet Take 1 tablet by mouth 2 (two) times daily. 03/27/18   Joni Reining, PA-C    Allergies Doxycycline  No family history on file.  Social History Social History   Tobacco Use  . Smoking status: Current Every Day Smoker    Packs/day: 1.00    Types: Cigarettes  . Smokeless tobacco: Never Used  Substance Use Topics  . Alcohol use: No  . Drug use: Not on file    Review of Systems  Constitutional: No fever/chills Eyes: No visual changes. ENT: No sore throat. Cardiovascular: Denies chest pain. Respiratory: Denies shortness of breath. Gastrointestinal: No abdominal pain.  No nausea, no vomiting.  No diarrhea.  No constipation. Genitourinary: Negative for dysuria. Musculoskeletal: Negative for back pain. Skin: Negative for rash. Neurological: History of migraine headaches.   Allergic/Immunilogical: Doxycycline. ____________________________________________  PHYSICAL EXAM:  VITAL SIGNS: ED Triage Vitals  Enc Vitals Group     BP 03/27/18 0821 (!) 144/88     Pulse Rate 03/27/18 0821 83     Resp 03/27/18 0821 14     Temp 03/27/18 0821 98.1 F (36.7 C)     Temp Source 03/27/18 0821 Oral     SpO2 03/27/18 0821 99 %     Weight 03/27/18 0822 190 lb (86.2 kg)     Height 03/27/18 0822 5\' 2"  (1.575 m)     Head Circumference --      Peak Flow --      Pain Score 03/27/18 0822 7      Pain Loc --      Pain Edu? --      Excl. in GC? --     Constitutional: Alert and oriented. Well appearing and in no acute distress. Hematological/Lymphatic/Immunilogical: No inguinal lymphadenopathy. Cardiovascular: Normal rate, regular rhythm. Grossly normal heart sounds.  Good peripheral circulation. Respiratory: Normal respiratory effort.  No retractions. Lungs CTAB. Gastrointestinal: Soft and nontender. No distention. No abdominal bruits. No CVA tenderness. Musculoskeletal: No lower extremity tenderness nor edema.  No joint effusions. Neurologic:  Normal speech and language. No gross focal neurologic deficits are appreciated. No gait instability. Skin:  Skin is warm, dry and intact.  Papular lesion on erythematous base right medial thigh fold area.  Lesion is nonfluctuant. Psychiatric: Mood and affect are normal. Speech and behavior are normal.  ____________________________________________   LABS (all labs ordered are listed, but only abnormal results are displayed)  Labs Reviewed - No data to display ____________________________________________  EKG   ____________________________________________  RADIOLOGY  ED MD interpretation:    Official radiology report(s): No results found.  ____________________________________________   PROCEDURES  Procedure(s) performed: None  Procedures  Critical Care performed: No  ____________________________________________   INITIAL IMPRESSION / ASSESSMENT AND PLAN / ED COURSE  As part of my medical decision making, I reviewed the following data within the electronic MEDICAL RECORD NUMBER    Pain and swelling right medial thigh for area secondary to abscess.  Area is nonfluctuant.  Discussed with patient rationale for not incised and drained at this time.  Patient given discharge care instruction advised take medication as directed.  Patient given a work note advised return to ED if condition worsens.       ____________________________________________   FINAL CLINICAL IMPRESSION(S) / ED DIAGNOSES  Final diagnoses:  Abscess     ED Discharge Orders         Ordered    sulfamethoxazole-trimethoprim (BACTRIM DS,SEPTRA DS) 800-160 MG tablet  2 times daily     03/27/18 0852    naproxen (NAPROSYN) 500 MG tablet  2 times daily with meals     03/27/18 1610           Note:  This document was prepared using Dragon voice recognition software and may include unintentional dictation errors.    Joni Reining, PA-C 03/27/18 9604    Pershing Proud Myra Rude, MD 03/27/18 [phone]

## 2018-06-15 ENCOUNTER — Encounter: Payer: Self-pay | Admitting: Emergency Medicine

## 2018-06-15 ENCOUNTER — Other Ambulatory Visit: Payer: Self-pay

## 2018-06-15 DIAGNOSIS — B349 Viral infection, unspecified: Secondary | ICD-10-CM | POA: Insufficient documentation

## 2018-06-15 DIAGNOSIS — F17210 Nicotine dependence, cigarettes, uncomplicated: Secondary | ICD-10-CM | POA: Insufficient documentation

## 2018-06-15 DIAGNOSIS — J029 Acute pharyngitis, unspecified: Secondary | ICD-10-CM | POA: Insufficient documentation

## 2018-06-15 DIAGNOSIS — J358 Other chronic diseases of tonsils and adenoids: Secondary | ICD-10-CM | POA: Insufficient documentation

## 2018-06-15 DIAGNOSIS — Z79899 Other long term (current) drug therapy: Secondary | ICD-10-CM | POA: Insufficient documentation

## 2018-06-15 NOTE — ED Triage Notes (Signed)
Patient ambulatory to triage with steady gait, without difficulty or distress noted; pt reports today having sore throat frontal HA; denies cough or congestion

## 2018-06-16 ENCOUNTER — Emergency Department
Admission: EM | Admit: 2018-06-16 | Discharge: 2018-06-16 | Disposition: A | Discharge status: Home / Self Care | Payer: Self-pay | Attending: Emergency Medicine | Admitting: Emergency Medicine

## 2018-06-16 DIAGNOSIS — J358 Other chronic diseases of tonsils and adenoids: Secondary | ICD-10-CM

## 2018-06-16 DIAGNOSIS — B349 Viral infection, unspecified: Secondary | ICD-10-CM

## 2018-06-16 DIAGNOSIS — J029 Acute pharyngitis, unspecified: Secondary | ICD-10-CM

## 2018-06-16 LAB — GROUP A STREP BY PCR: Group A Strep by PCR: NOT DETECTED

## 2018-06-16 NOTE — ED Provider Notes (Signed)
[facility] Emergency Department Provider Note  ____________________________________________   First MD Initiated Contact with Patient 06/16/18 0206     (approximate)  I have reviewed the triage vital signs and the nursing notes.   HISTORY  Chief Complaint Sore Throat and Headache    HPI Susan Shelton is a 28 y.o. female who presents for evaluation of acute onset sore throat earlier today that is moderate and feels worse when she tries to swallow.  She saw a white spot on her right tonsil as well.  She was concerned because she performed oral sex on a female partner a few days ago and was concerned that that might have something to do with the sore throat.  She is having a mild cough as well and is felt a little bit of nasal congestion.  She is not having any trouble swallowing even though it hurts little bit to do so.  She denies fever and says she has had some recent chills.  She denies chest pain, shortness of breath, nausea, vomiting, and abdominal pain.  Nothing particular makes her symptoms better.  She has had a little bit of a headache recently but that has resolved.  Past Medical History:  Diagnosis Date  . Bacterial vaginosis   . Migraines     There are no active problems to display for this patient.   History reviewed. No pertinent surgical history.  Prior to Admission medications   Medication Sig Start Date End Date Taking? Authorizing Provider  albuterol (PROVENTIL HFA;VENTOLIN HFA) 108 (90 Base) MCG/ACT inhaler Inhale 2 puffs into the lungs every 4 (four) hours as needed for wheezing or shortness of breath. 11/05/16   Irean HongSung, Jade J, MD  azithromycin (ZITHROMAX Z-PAK) 250 MG tablet Take 2 tablets (500 mg) on  Day 1,  followed by 1 tablet (250 mg) once daily on Days 2 through 5. 02/23/16   Beers, Charmayne Sheerharles M, PA-C  chlorpheniramine (CHLOR-TRIMETON) 4 MG tablet Take 1 tablet (4 mg total) by mouth 2 (two) times daily as needed for allergies or  rhinitis. 02/23/16   Beers, Charmayne Sheerharles M, PA-C  chlorpheniramine-HYDROcodone (TUSSIONEX PENNKINETIC ER) 10-8 MG/5ML SUER Take 5 mLs by mouth 2 (two) times daily. 11/05/16   Irean HongSung, Jade J, MD  cyclobenzaprine (FLEXERIL) 5 MG tablet Take 1-2 tablets 3 times daily as needed 09/29/17   Enid DerryWagner, Ashley, PA-C  guaiFENesin-codeine 100-10 MG/5ML syrup Take 10 mLs by mouth every 4 (four) hours as needed for cough. 02/23/16   Beers, Charmayne Sheerharles M, PA-C  ibuprofen (ADVIL,MOTRIN) 800 MG tablet Take 1 tablet (800 mg total) by mouth every 8 (eight) hours as needed for moderate pain. 01/18/15   Joni ReiningSmith, Ronald K, PA-C  ketorolac (TORADOL) 10 MG tablet Take 1 tablet (10 mg total) by mouth every 6 (six) hours as needed. 09/29/17   Enid DerryWagner, Ashley, PA-C  methylPREDNISolone (MEDROL DOSEPAK) 4 MG TBPK tablet Take as directed 11/05/16   Irean HongSung, Jade J, MD  naproxen (NAPROSYN) 500 MG tablet Take 1 tablet (500 mg total) by mouth 2 (two) times daily with a meal. 03/27/18   Joni ReiningSmith, Ronald K, PA-C  sulfamethoxazole-trimethoprim (BACTRIM DS,SEPTRA DS) 800-160 MG tablet Take 1 tablet by mouth 2 (two) times daily. 03/27/18   Joni ReiningSmith, Ronald K, PA-C    Allergies Doxycycline  No family history on file.  Social History Social History   Tobacco Use  . Smoking status: Current Every Day Smoker    Packs/day: 1.00    Types: Cigarettes  . Smokeless  tobacco: Never Used  Substance Use Topics  . Alcohol use: No  . Drug use: Not on file    Review of Systems Constitutional: No fever/chills.  General malaise. Eyes: No visual changes. ENT: Sore throat and some nasal congestion as described above. Cardiovascular: Denies chest pain. Respiratory: Mild cough.  Denies shortness of breath. Gastrointestinal: No abdominal pain.  No nausea, no vomiting.  No diarrhea.  No constipation. Genitourinary: Negative for dysuria. Musculoskeletal: Negative for neck pain.  Negative for back pain. Integumentary: Negative for rash. Neurological: Negative for headaches,  focal weakness or numbness.   ____________________________________________   PHYSICAL EXAM:  VITAL SIGNS: ED Triage Vitals  Enc Vitals Group     BP 06/16/18 0021 112/69     Pulse Rate 06/16/18 0021 96     Resp 06/16/18 0021 20     Temp 06/16/18 0021 98.4 F (36.9 C)     Temp Source 06/16/18 0021 Oral     SpO2 06/16/18 0021 98 %     Weight 06/15/18 2327 86.2 kg (190 lb)     Height 06/15/18 2327 1.575 m (5\' 2" )     Head Circumference --      Peak Flow --      Pain Score 06/15/18 2327 8     Pain Loc --      Pain Edu? --      Excl. in GC? --     Constitutional: Alert and oriented. Well appearing and in no acute distress. Eyes: Conjunctivae are normal.  Head: Atraumatic. Nose: No congestion/rhinnorhea. Mouth/Throat: Mucous membranes are moist.  Oropharynx non-erythematous.  She has a tonsillolith present in the right tonsil but otherwise they are normal in appearance.  There is no petechiae on the oropharynx and no exudate on the tonsils. Neck: No stridor.  No meningeal signs.   Cardiovascular: Normal rate, regular rhythm. Good peripheral circulation. Grossly normal heart sounds. Respiratory: Normal respiratory effort.  No retractions. Lungs CTAB. Gastrointestinal: Soft and nontender. No distention.  Musculoskeletal: No lower extremity tenderness nor edema. No gross deformities of extremities. Neurologic:  Normal speech and language. No gross focal neurologic deficits are appreciated.  Skin:  Skin is warm, dry and intact. No rash noted. Psychiatric: Mood and affect are normal. Speech and behavior are normal.  ____________________________________________   LABS (all labs ordered are listed, but only abnormal results are displayed)  Labs Reviewed  GROUP A STREP BY PCR   ____________________________________________  EKG  None - EKG not ordered by ED physician ____________________________________________  RADIOLOGY   ED MD interpretation: No indication for  imaging  Official radiology report(s): No results found.  ____________________________________________   PROCEDURES  Critical Care performed: No   Procedure(s) performed:   Procedures   ____________________________________________   INITIAL IMPRESSION / ASSESSMENT AND PLAN / ED COURSE  As part of my medical decision making, I reviewed the following data within the electronic MEDICAL RECORD NUMBER Nursing notes reviewed and incorporated, Labs reviewed , Old chart reviewed and Notes from prior ED visits    Differential diagnosis includes, but is not limited to, viral infection, strep pharyngitis, viral pharyngitis, odontogenic infection, tonsillolith, STD of the throat.  Fortunately her vital signs are stable and her physical exam is reassuring.  Strep test is negative.  I think some of the discomfort she is feeling is from the tonsilith, not from acute infection, but viral infection is always very possible as well given her other symptoms.  I encouraged over-the-counter medication and salt water gargles and close  outpatient follow-up.  No indication for further evaluation at this time.  No indication of an emergent medical condition.  I gave my usual and customary return precautions.  She understands and agrees with the plan.     ____________________________________________  FINAL CLINICAL IMPRESSION(S) / ED DIAGNOSES  Final diagnoses:  Sore throat  Tonsillith  Viral illness     MEDICATIONS GIVEN DURING THIS VISIT:  Medications - No data to display   ED Discharge Orders    None       Note:  This document was prepared using Dragon voice recognition software and may include unintentional dictation errors.    Loleta Rose, MD 06/16/18 0300

## 2018-06-16 NOTE — Discharge Instructions (Addendum)
Your strep test was negative and most likely your symptoms are caused by a viral infection.  You also have a tonsil lift, or tonsil stone, that is visible on the right side.  This is a benign condition but can be frustrating and uncomfortable.  Please read through the information included about sore throat and viral illness management recommendations.  Use over-the-counter ibuprofen and Tylenol and try gargling with warm salt water.  Return to the emergency department if you develop new or worsening symptoms that concern you.

## 2018-06-16 NOTE — ED Notes (Addendum)
E-signature not working at this time.Pt verbalized understanding of d/c instructions and f/u care. No further questions at this time. Pt ambulatory to the exit with steady gait.

## 2018-06-16 NOTE — ED Notes (Signed)
MD at bedside at this time.

## 2019-03-15 ENCOUNTER — Other Ambulatory Visit: Payer: Self-pay

## 2019-03-15 DIAGNOSIS — Z20822 Contact with and (suspected) exposure to covid-19: Secondary | ICD-10-CM

## 2019-03-17 LAB — NOVEL CORONAVIRUS, NAA: SARS-CoV-2, NAA: NOT DETECTED

## 2019-03-28 ENCOUNTER — Encounter: Payer: Self-pay | Admitting: Emergency Medicine

## 2019-03-28 ENCOUNTER — Other Ambulatory Visit: Payer: Self-pay

## 2019-03-28 ENCOUNTER — Emergency Department
Admission: EM | Admit: 2019-03-28 | Discharge: 2019-03-28 | Disposition: A | Discharge status: Home / Self Care | Payer: Self-pay | Attending: Emergency Medicine | Admitting: Emergency Medicine

## 2019-03-28 DIAGNOSIS — K920 Hematemesis: Secondary | ICD-10-CM | POA: Insufficient documentation

## 2019-03-28 DIAGNOSIS — I10 Essential (primary) hypertension: Secondary | ICD-10-CM | POA: Insufficient documentation

## 2019-03-28 DIAGNOSIS — K2921 Alcoholic gastritis with bleeding: Secondary | ICD-10-CM | POA: Insufficient documentation

## 2019-03-28 DIAGNOSIS — Z79899 Other long term (current) drug therapy: Secondary | ICD-10-CM | POA: Insufficient documentation

## 2019-03-28 DIAGNOSIS — F1010 Alcohol abuse, uncomplicated: Secondary | ICD-10-CM | POA: Insufficient documentation

## 2019-03-28 DIAGNOSIS — F17210 Nicotine dependence, cigarettes, uncomplicated: Secondary | ICD-10-CM | POA: Insufficient documentation

## 2019-03-28 HISTORY — DX: Essential (primary) hypertension: I10

## 2019-03-28 LAB — HEMOGLOBIN AND HEMATOCRIT, BLOOD
HCT: 39.9 % (ref 36.0–46.0)
Hemoglobin: 12.8 g/dL (ref 12.0–15.0)

## 2019-03-28 LAB — URINALYSIS, COMPLETE (UACMP) WITH MICROSCOPIC
Bacteria, UA: NONE SEEN
Bilirubin Urine: NEGATIVE
Glucose, UA: NEGATIVE mg/dL
Hgb urine dipstick: NEGATIVE
Ketones, ur: NEGATIVE mg/dL
Leukocytes,Ua: NEGATIVE
Nitrite: NEGATIVE
Protein, ur: NEGATIVE mg/dL
Specific Gravity, Urine: 1.023 (ref 1.005–1.030)
pH: 7 (ref 5.0–8.0)

## 2019-03-28 LAB — CBC
HCT: 41.5 % (ref 36.0–46.0)
Hemoglobin: 13.2 g/dL (ref 12.0–15.0)
MCH: 28.1 pg (ref 26.0–34.0)
MCHC: 31.8 g/dL (ref 30.0–36.0)
MCV: 88.3 fL (ref 80.0–100.0)
Platelets: 369 10*3/uL (ref 150–400)
RBC: 4.7 MIL/uL (ref 3.87–5.11)
RDW: 12.5 % (ref 11.5–15.5)
WBC: 13.2 10*3/uL — ABNORMAL HIGH (ref 4.0–10.5)
nRBC: 0 % (ref 0.0–0.2)

## 2019-03-28 LAB — COMPREHENSIVE METABOLIC PANEL
ALT: 19 U/L (ref 0–44)
AST: 19 U/L (ref 15–41)
Albumin: 4.5 g/dL (ref 3.5–5.0)
Alkaline Phosphatase: 48 U/L (ref 38–126)
Anion gap: 9 (ref 5–15)
BUN: 11 mg/dL (ref 6–20)
CO2: 28 mmol/L (ref 22–32)
Calcium: 9.4 mg/dL (ref 8.9–10.3)
Chloride: 103 mmol/L (ref 98–111)
Creatinine, Ser: 0.67 mg/dL (ref 0.44–1.00)
GFR calc Af Amer: >60 mL/min (ref >60)
GFR calc non Af Amer: >60 mL/min (ref >60)
Glucose, Bld: 107 mg/dL — ABNORMAL HIGH (ref 70–99)
Potassium: 3.7 mmol/L (ref 3.5–5.1)
Sodium: 140 mmol/L (ref 135–145)
Total Bilirubin: 0.5 mg/dL (ref 0.3–1.2)
Total Protein: 8.1 g/dL (ref 6.5–8.1)

## 2019-03-28 LAB — LIPASE, BLOOD: Lipase: 28 U/L (ref 11–51)

## 2019-03-28 LAB — POCT PREGNANCY, URINE: Preg Test, Ur: NEGATIVE

## 2019-03-28 MED ORDER — ONDANSETRON 4 MG PO TBDP: 4.0000 mg | ORAL_TABLET | Freq: Three times a day (TID) | ORAL | 0 refills | Status: DC | PRN

## 2019-03-28 MED ORDER — SODIUM CHLORIDE 0.9% FLUSH: 3.0000 mL | Freq: Once | INTRAVENOUS | Status: DC

## 2019-03-28 MED ORDER — OMEPRAZOLE 20 MG PO CPDR: 20.0000 mg | DELAYED_RELEASE_CAPSULE | Freq: Every day | ORAL | 1 refills | Status: DC

## 2019-03-28 MED ORDER — ONDANSETRON 4 MG PO TBDP
4.0000 mg | ORAL_TABLET | Freq: Once | ORAL | Status: AC
  Administered 2019-03-28: 12:00:00 4 mg via ORAL
  Filled 2019-03-28: qty 1

## 2019-03-28 MED ORDER — ALUM & MAG HYDROXIDE-SIMETH 200-200-20 MG/5ML PO SUSP
15.0000 mL | Freq: Once | ORAL | Status: AC
  Administered 2019-03-28: 12:00:00 15 mL via ORAL
  Filled 2019-03-28: qty 30

## 2019-03-28 MED ORDER — LIDOCAINE VISCOUS HCL 2 % MT SOLN
15.0000 mL | Freq: Once | OROMUCOSAL | Status: AC
  Administered 2019-03-28: 12:00:00 15 mL via ORAL
  Filled 2019-03-28: qty 15

## 2019-03-28 NOTE — ED Notes (Signed)
See triage note  Presents with some n/v  And burning in epigastric area  States vomiting started about 7 am   States she noticed some blood in her emesis once this am  States she did drink a lot on ETOH last PM

## 2019-03-28 NOTE — ED Provider Notes (Signed)
[facility] Emergency Department Provider Note   ____________________________________________   First MD Initiated Contact with Patient 03/28/19 1132     (approximate)  I have reviewed the triage vital signs and the nursing notes.   HISTORY  Chief Complaint Emesis    HPI Susan Shelton is a 28 y.o. female with past medical history of hypertension who presents to the ED complaining of hematemesis.  Patient reports that she has vomited multiple times since waking up this morning and has noticed blood in her vomit.  She states it was initially only small streaks of blood, but the second time that she went to vomit it was a larger amount.  She has now vomited a third time and has no longer noticed blood in her emesis.  She also complains of some epigastric pain, but denies any blood in her stool or dark tarry stool.  She has not had any fevers, cough, chest pain or shortness of breath.  She does admit to significant alcohol consumption, does not drink daily but has been drinking heavily on the weekends including last night.  She denies any NSAID use.        Past Medical History:  Diagnosis Date  . Bacterial vaginosis   . Hypertension   . Migraines     There are no active problems to display for this patient.   Past Surgical History:  Procedure Laterality Date  . ABDOMINAL SURGERY      Prior to Admission medications   Medication Sig Start Date End Date Taking? Authorizing Provider  albuterol (PROVENTIL HFA;VENTOLIN HFA) 108 (90 Base) MCG/ACT inhaler Inhale 2 puffs into the lungs every 4 (four) hours as needed for wheezing or shortness of breath. 11/05/16   Irean Hong, MD  cyclobenzaprine (FLEXERIL) 5 MG tablet Take 1-2 tablets 3 times daily as needed 09/29/17   Enid Derry, PA-C  ibuprofen (ADVIL,MOTRIN) 800 MG tablet Take 1 tablet (800 mg total) by mouth every 8 (eight) hours as needed for moderate pain. 01/18/15   Joni Reining, PA-C  ketorolac  (TORADOL) 10 MG tablet Take 1 tablet (10 mg total) by mouth every 6 (six) hours as needed. 09/29/17   Enid Derry, PA-C  methylPREDNISolone (MEDROL DOSEPAK) 4 MG TBPK tablet Take as directed 11/05/16   Irean Hong, MD  naproxen (NAPROSYN) 500 MG tablet Take 1 tablet (500 mg total) by mouth 2 (two) times daily with a meal. 03/27/18   Joni Reining, PA-C  omeprazole (PRILOSEC) 20 MG capsule Take 1 capsule (20 mg total) by mouth daily. 03/28/19 05/27/19  Chesley Noon, MD  ondansetron (ZOFRAN ODT) 4 MG disintegrating tablet Take 1 tablet (4 mg total) by mouth every 8 (eight) hours as needed for nausea or vomiting. 03/28/19   Chesley Noon, MD    Allergies Doxycycline  No family history on file.  Social History Social History   Tobacco Use  . Smoking status: Current Every Day Smoker    Packs/day: 1.00    Types: Cigarettes  . Smokeless tobacco: Never Used  Substance Use Topics  . Alcohol use: No  . Drug use: Not on file    Review of Systems  Constitutional: No fever/chills Eyes: No visual changes. ENT: No sore throat. Cardiovascular: Denies chest pain. Respiratory: Denies shortness of breath. Gastrointestinal: Positive for abdominal pain.  Positive for nausea, vomiting, and hematemesis.  No diarrhea.  No constipation. Genitourinary: Negative for dysuria. Musculoskeletal: Negative for back pain. Skin: Negative for rash. Neurological: Negative  for headaches, focal weakness or numbness.  ____________________________________________   PHYSICAL EXAM:  VITAL SIGNS: ED Triage Vitals  Enc Vitals Group     BP 03/28/19 0926 (!) 136/109     Pulse Rate 03/28/19 0926 86     Resp 03/28/19 0926 18     Temp 03/28/19 0926 98.8 F (37.1 C)     Temp Source 03/28/19 0926 Oral     SpO2 03/28/19 0926 100 %     Weight 03/28/19 0850 190 lb (86.2 kg)     Height 03/28/19 0850 5\' 2"  (1.575 m)     Head Circumference --      Peak Flow --      Pain Score 03/28/19 0850 8     Pain Loc --       Pain Edu? --      Excl. in [place]? --     Constitutional: Alert and oriented. Eyes: Conjunctivae are normal. Head: Atraumatic. Nose: No congestion/rhinnorhea. Mouth/Throat: Mucous membranes are moist. Neck: Normal ROM Cardiovascular: Normal rate, regular rhythm. Grossly normal heart sounds. Respiratory: Normal respiratory effort.  No retractions. Lungs CTAB. Gastrointestinal: Soft and nontender. No distention. Genitourinary: deferred Musculoskeletal: No lower extremity tenderness nor edema. Neurologic:  Normal speech and language. No gross focal neurologic deficits are appreciated. Skin:  Skin is warm, dry and intact. No rash noted. Psychiatric: Mood and affect are normal. Speech and behavior are normal.  ____________________________________________   LABS (all labs ordered are listed, but only abnormal results are displayed)  Labs Reviewed  COMPREHENSIVE METABOLIC PANEL - Abnormal; Notable for the following components:      Result Value   Glucose, Bld 107 (*)    All other components within normal limits  CBC - Abnormal; Notable for the following components:   WBC 13.2 (*)    All other components within normal limits  URINALYSIS, COMPLETE (UACMP) WITH MICROSCOPIC - Abnormal; Notable for the following components:   Color, Urine YELLOW (*)    APPearance CLEAR (*)    All other components within normal limits  LIPASE, BLOOD  HEMOGLOBIN AND HEMATOCRIT, BLOOD  POC URINE PREG, ED  POCT PREGNANCY, URINE     PROCEDURES  Procedure(s) performed (including Critical Care):  Procedures   ____________________________________________   INITIAL IMPRESSION / ASSESSMENT AND PLAN / ED COURSE       28 year old female presents to the ED complaining of upper abdominal pain, vomiting, and hematemesis since this morning in the setting of recent heavy alcohol abuse.  She initially vomited up food, subsequently noticed large amount of blood in her emesis but this now seems to have  resolved.  She vomited once in the ED and there was no blood noted in her emesis bag.  She also denies any bloody stools or melena.  Doubt significant upper GI bleed as her repeat H&H is stable.  She does likely have an element of alcoholic gastritis as well as possible Mallory-Weiss tear.  Remainder of her labs are unremarkable and she felt much better following Zofran and GI cocktail.  Counseled patient on decreasing her alcohol intake, will start on PPI and provide with follow-up with GI.  Counseled patient to return to the ED for new or worsening symptoms, patient agrees with plan.      ____________________________________________   FINAL CLINICAL IMPRESSION(S) / ED DIAGNOSES  Final diagnoses:  Hematemesis with nausea  Alcohol abuse  Acute alcoholic gastritis with hemorrhage     ED Discharge Orders  Ordered    ondansetron (ZOFRAN ODT) 4 MG disintegrating tablet  Every 8 hours PRN     03/28/19 1234    omeprazole (PRILOSEC) 20 MG capsule  Daily     03/28/19 1234           Note:  This document was prepared using Dragon voice recognition software and may include unintentional dictation errors.   Blake Divine, MD 03/28/19 [phone]

## 2019-03-28 NOTE — ED Triage Notes (Signed)
Pt here with c/o vomiting around 0700 this am, was drinking last night, states dark red sputum earlier this week and dark red in her vomit today. NAD.

## 2019-04-18 ENCOUNTER — Other Ambulatory Visit: Payer: Self-pay

## 2019-04-18 ENCOUNTER — Encounter: Payer: Self-pay | Admitting: Emergency Medicine

## 2019-04-18 ENCOUNTER — Emergency Department
Admission: EM | Admit: 2019-04-18 | Discharge: 2019-04-18 | Disposition: A | Discharge status: Home / Self Care | Payer: Self-pay | Attending: Emergency Medicine | Admitting: Emergency Medicine

## 2019-04-18 ENCOUNTER — Emergency Department: Payer: Self-pay

## 2019-04-18 DIAGNOSIS — R1030 Lower abdominal pain, unspecified: Secondary | ICD-10-CM | POA: Insufficient documentation

## 2019-04-18 DIAGNOSIS — F17210 Nicotine dependence, cigarettes, uncomplicated: Secondary | ICD-10-CM | POA: Insufficient documentation

## 2019-04-18 DIAGNOSIS — I10 Essential (primary) hypertension: Secondary | ICD-10-CM | POA: Insufficient documentation

## 2019-04-18 DIAGNOSIS — N939 Abnormal uterine and vaginal bleeding, unspecified: Secondary | ICD-10-CM | POA: Insufficient documentation

## 2019-04-18 LAB — CBC WITH DIFFERENTIAL/PLATELET
Abs Immature Granulocytes: 0.03 10*3/uL (ref 0.00–0.07)
Basophils Absolute: 0.1 10*3/uL (ref 0.0–0.1)
Basophils Relative: 1 %
Eosinophils Absolute: 0.4 10*3/uL (ref 0.0–0.5)
Eosinophils Relative: 5 %
HCT: 38.6 % (ref 36.0–46.0)
Hemoglobin: 12.1 g/dL (ref 12.0–15.0)
Immature Granulocytes: 0 %
Lymphocytes Relative: 23 %
Lymphs Abs: 1.9 10*3/uL (ref 0.7–4.0)
MCH: 28.1 pg (ref 26.0–34.0)
MCHC: 31.3 g/dL (ref 30.0–36.0)
MCV: 89.6 fL (ref 80.0–100.0)
Monocytes Absolute: 0.7 10*3/uL (ref 0.1–1.0)
Monocytes Relative: 8 %
Neutro Abs: 5.4 10*3/uL (ref 1.7–7.7)
Neutrophils Relative %: 63 %
Platelets: 338 10*3/uL (ref 150–400)
RBC: 4.31 MIL/uL (ref 3.87–5.11)
RDW: 12.9 % (ref 11.5–15.5)
WBC: 8.5 10*3/uL (ref 4.0–10.5)
nRBC: 0 % (ref 0.0–0.2)

## 2019-04-18 LAB — URINALYSIS, COMPLETE (UACMP) WITH MICROSCOPIC
Bacteria, UA: NONE SEEN
Bilirubin Urine: NEGATIVE
Glucose, UA: NEGATIVE mg/dL
Ketones, ur: NEGATIVE mg/dL
Leukocytes,Ua: NEGATIVE
Nitrite: NEGATIVE
Protein, ur: NEGATIVE mg/dL
Specific Gravity, Urine: 1.025 (ref 1.005–1.030)
pH: 5 (ref 5.0–8.0)

## 2019-04-18 LAB — CHLAMYDIA/NGC RT PCR (ARMC ONLY)
Chlamydia Tr: NOT DETECTED
N gonorrhoeae: NOT DETECTED

## 2019-04-18 LAB — WET PREP, GENITAL
Clue Cells Wet Prep HPF POC: NONE SEEN
Sperm: NONE SEEN
Trich, Wet Prep: NONE SEEN
Yeast Wet Prep HPF POC: NONE SEEN

## 2019-04-18 LAB — BASIC METABOLIC PANEL
Anion gap: 9 (ref 5–15)
BUN: 14 mg/dL (ref 6–20)
CO2: 24 mmol/L (ref 22–32)
Calcium: 9.1 mg/dL (ref 8.9–10.3)
Chloride: 105 mmol/L (ref 98–111)
Creatinine, Ser: 0.96 mg/dL (ref 0.44–1.00)
GFR calc Af Amer: >60 mL/min (ref >60)
GFR calc non Af Amer: >60 mL/min (ref >60)
Glucose, Bld: 128 mg/dL — ABNORMAL HIGH (ref 70–99)
Potassium: 3.7 mmol/L (ref 3.5–5.1)
Sodium: 138 mmol/L (ref 135–145)

## 2019-04-18 LAB — POCT PREGNANCY, URINE: Preg Test, Ur: NEGATIVE

## 2019-04-18 NOTE — ED Provider Notes (Signed)
[facility] Emergency Department Provider Note ____________________________________________   First MD Initiated Contact with Patient 04/18/19 0913     (approximate)  I have reviewed the triage vital signs and the nursing notes.   HISTORY  Chief Complaint Vaginal Bleeding    HPI Susan Shelton is a 28 y.o. female with PMH as noted below who presents with vaginal bleeding over the last 2 weeks, persistent course, described mainly as spotting, and associated with intermittent crampy pain in the suprapubic area.  She denies associated fever chills, vomiting, diarrhea, or urinary symptoms.  The vaginal bleeding started as her normal period which usually lasts for about 7 days.   Past Medical History:  Diagnosis Date  . Bacterial vaginosis   . Hypertension   . Migraines     There are no active problems to display for this patient.   Past Surgical History:  Procedure Laterality Date  . ABDOMINAL SURGERY      Prior to Admission medications   Medication Sig Start Date End Date Taking? Authorizing Provider  omeprazole (PRILOSEC) 20 MG capsule Take 1 capsule (20 mg total) by mouth daily. 03/28/19 05/27/19  Blake Divine, MD  ondansetron (ZOFRAN ODT) 4 MG disintegrating tablet Take 1 tablet (4 mg total) by mouth every 8 (eight) hours as needed for nausea or vomiting. 03/28/19   Blake Divine, MD    Allergies Doxycycline  No family history on file.  Social History Social History   Tobacco Use  . Smoking status: Current Every Day Smoker    Packs/day: 1.00    Types: Cigarettes  . Smokeless tobacco: Never Used  Substance Use Topics  . Alcohol use: No  . Drug use: Not on file    Review of Systems  Constitutional: No fever/chills. Eyes: No visual changes. ENT: No sore throat. Cardiovascular: Denies chest pain. Respiratory: Denies shortness of breath. Gastrointestinal: No vomiting or diarrhea.  Genitourinary: Negative for dysuria.   Positive for vaginal bleeding. Musculoskeletal: Negative for back pain. Skin: Negative for rash. Neurological: Negative for headache.   ____________________________________________   PHYSICAL EXAM:  VITAL SIGNS: ED Triage Vitals  Enc Vitals Group     BP 04/18/19 0903 133/79     Pulse Rate 04/18/19 0903 (!) 107     Resp 04/18/19 0903 16     Temp 04/18/19 0903 99.4 F (37.4 C)     Temp Source 04/18/19 0903 Oral     SpO2 04/18/19 0903 96 %     Weight 04/18/19 0902 196 lb (88.9 kg)     Height 04/18/19 0902 5\' 2"  (1.575 m)     Head Circumference --      Peak Flow --      Pain Score 04/18/19 0902 5     Pain Loc --      Pain Edu? --      Excl. in [place]? --     Constitutional: Alert and oriented. Well appearing and in no acute distress. Eyes: Conjunctivae are normal.  Head: Atraumatic. Nose: No congestion/rhinnorhea. Mouth/Throat: Mucous membranes are moist.   Neck: Normal range of motion.  Cardiovascular: Good peripheral circulation. Respiratory: Normal respiratory effort.  No retractions.  Gastrointestinal: Soft and nontender. No distention.  Genitourinary: Normal external genitalia.  Small amount of blood from the cervical os with no active hemorrhage.  No CMT or adnexal tenderness. Musculoskeletal: Extremities warm and well perfused.  Neurologic:  Normal speech and language. No gross focal neurologic deficits are appreciated.  Skin:  Skin is warm  and dry. No rash noted. Psychiatric: Mood and affect are normal. Speech and behavior are normal.  ____________________________________________   LABS (all labs ordered are listed, but only abnormal results are displayed)  Labs Reviewed  WET PREP, GENITAL - Abnormal; Notable for the following components:      Result Value   WBC, Wet Prep HPF POC FEW (*)    All other components within normal limits  BASIC METABOLIC PANEL - Abnormal; Notable for the following components:   Glucose, Bld 128 (*)    All other components within  normal limits  URINALYSIS, COMPLETE (UACMP) WITH MICROSCOPIC - Abnormal; Notable for the following components:   Color, Urine YELLOW (*)    APPearance CLEAR (*)    Hgb urine dipstick SMALL (*)    All other components within normal limits  CHLAMYDIA/NGC RT PCR ([facility] ONLY)  CBC WITH DIFFERENTIAL/PLATELET  POC URINE PREG, ED  POCT PREGNANCY, URINE   ____________________________________________  EKG   ____________________________________________  RADIOLOGY  US pelvis: No acute abnormality  ____________________________________________   PROCEDURES  Procedure(s) performed: No  Procedures  Critical Care performed: No ____________________________________________   INITIAL IMPRESSION / ASSESSMENT AND PLAN / ED COURSE  Pertinent labs & imaging results that were available during my care of the patient were reviewed by me and considered in my medical decision making (see chart for details).  28 year old female with PMH as noted above presents with vaginal bleeding over the last 2 weeks which initially started as her normal period.  She has some suprapubic area cramping but no continued pain, and no fevers or vaginal discharge.  On exam, she is well-appearing.  Her vital signs are normal except that she was slightly tachycardic when she arrived.  This has now resolved.  The abdomen is soft and nontender.  Pelvic exam reveals a trace amount of blood at the cervix, but no masses or active hemorrhage.  Overall presentation is consistent with nonspecific dysfunctional uterine bleeding.  We will obtain basic labs to rule out anemia due to significant blood loss, and obtain a pelvic ultrasound.  ----------------------------------------- 2:52 PM on 04/18/2019 -----------------------------------------  Ultrasound and lab work-up were unremarkable.  I discussed the results of the work-up with the patient.  I gave her referrals for OB/GYN and instructed her to follow-up as an  outpatient.  Return precautions were provided, and the patient expressed understanding.  ____________________________________________   FINAL CLINICAL IMPRESSION(S) / ED DIAGNOSES  Final diagnoses:  Vaginal bleeding  Abnormal uterine bleeding      NEW MEDICATIONS STARTED DURING THIS VISIT:  Discharge Medication List as of 04/18/2019 12:40 PM       Note:  This document was prepared using Dragon voice recognition software and may include unintentional dictation errors.    Dionne Bucy, MD 04/18/19 1524

## 2019-04-18 NOTE — ED Notes (Signed)
Pt taken to [place] at this time on stretcher.

## 2019-04-18 NOTE — Discharge Instructions (Addendum)
You should follow-up with an OB/GYN within the next 1 to 2 weeks. We have provided information for 2 of our area OB/GYN practices for you to call. Return to the ER for new, worsening, or persistent severe bleeding, weakness or lightheadedness, worsening pain, or any other new or worsening symptoms that concern you.

## 2019-04-18 NOTE — ED Notes (Signed)
Pt states vaginal bleeding x 2 weeks. States pinkish brown in color. States clots were present initially. Denies using more than 1 pad per hour. Denies taking any birth control. A&O, ambulatory. No distress noted.

## 2019-04-18 NOTE — ED Triage Notes (Signed)
Pt c/o having vaginal bleeding for the past 14 days, states she normally has one for 7 days and is having 5/10 abd cramping. Pt is in NAD. Ambulatory to triage.

## 2019-09-15 ENCOUNTER — Other Ambulatory Visit: Payer: Self-pay

## 2019-09-15 ENCOUNTER — Encounter: Payer: Self-pay | Admitting: Advanced Practice Midwife

## 2019-09-15 ENCOUNTER — Ambulatory Visit: Payer: Self-pay | Admitting: Advanced Practice Midwife

## 2019-09-15 DIAGNOSIS — F17200 Nicotine dependence, unspecified, uncomplicated: Secondary | ICD-10-CM

## 2019-09-15 DIAGNOSIS — Z113 Encounter for screening for infections with a predominantly sexual mode of transmission: Secondary | ICD-10-CM

## 2019-09-15 DIAGNOSIS — B977 Papillomavirus as the cause of diseases classified elsewhere: Secondary | ICD-10-CM | POA: Insufficient documentation

## 2019-09-15 DIAGNOSIS — F1290 Cannabis use, unspecified, uncomplicated: Secondary | ICD-10-CM | POA: Insufficient documentation

## 2019-09-15 LAB — WET PREP FOR TRICH, YEAST, CLUE
Trichomonas Exam: NEGATIVE
Yeast Exam: NEGATIVE

## 2019-09-15 MED ORDER — MULTIVITAMINS PO CAPS: 1.0000 | ORAL_CAPSULE | Freq: Every day | ORAL | 0 refills | Status: AC

## 2019-09-15 NOTE — Progress Notes (Signed)
[facility] Department STI clinic/screening visit  Subjective:  Susan Shelton is a 29 y.o. SBF G1P1 smoker female being seen today for an STI screening visit. The patient reports they do not have symptoms.  Patient reports that they do desire a pregnancy in the next year.   They reported they are not interested in discussing contraception today.  Patient's last menstrual period was 09/01/2019 (exact date).   Patient has the following medical conditions:   Patient Active Problem List   Diagnosis Date Noted  . Smoker 11-15 cpd 09/15/2019  . Marijuana use 09/15/2019  . HPV  09/15/2019  . Adult BMI 29.0-29.9 kg/sq m 02/24/2015  . Migraine without aura and without status migrainosus, not intractable 03/15/2014    Chief Complaint  Patient presents with  . SEXUALLY TRANSMITTED DISEASE    HPI  Patient reports wants STD screen; LMP 09/01/19  See flowsheet for further details and programmatic requirements.    The following portions of the patient's history were reviewed and updated as appropriate: allergies, current medications, past medical history, past social history, past surgical history and problem list.  Objective:  There were no vitals filed for this visit.  Physical Exam Vitals and nursing note reviewed.  Constitutional:      Appearance: Normal appearance.  HENT:     Head: Normocephalic and atraumatic.     Mouth/Throat:     Mouth: Mucous membranes are moist.     Pharynx: Oropharynx is clear. No oropharyngeal exudate or posterior oropharyngeal erythema.  Eyes:     Conjunctiva/sclera: Conjunctivae normal.  Pulmonary:     Effort: Pulmonary effort is normal.  Abdominal:     General: Abdomen is flat.     Palpations: There is no mass.     Tenderness: There is no abdominal tenderness. There is no rebound.     Comments: Soft without tenderness, fair tone  Genitourinary:    General: Normal vulva.     Exam position: Lithotomy position.     Pubic Area: No  rash or pubic lice.      Labia:        Right: No rash or lesion.        Left: No rash or lesion.      Vagina: Normal. No vaginal discharge, erythema, bleeding or lesions.     Cervix: Normal.     Uterus: Normal.      Adnexa: Right adnexa normal and left adnexa normal.     Rectum: Normal.  Musculoskeletal:     Cervical back: Normal range of motion and neck supple.  Lymphadenopathy:     Head:     Right side of head: No preauricular or posterior auricular adenopathy.     Left side of head: No preauricular or posterior auricular adenopathy.     Cervical: No cervical adenopathy.     Upper Body:     Right upper body: No supraclavicular or axillary adenopathy.     Left upper body: No supraclavicular or axillary adenopathy.     Lower Body: No right inguinal adenopathy. No left inguinal adenopathy.  Skin:    General: Skin is warm and dry.     Findings: No rash.  Neurological:     Mental Status: She is alert and oriented to person, place, and time.      Assessment and Plan:  Susan Shelton is a 29 y.o. female presenting to the [facility] Department for STI screening  1. Screening examination for venereal disease Treat wet  mount per standing orders  Immunization nurse consult - Multiple Vitamin (MULTIVITAMIN) capsule; Take 1 capsule by mouth daily for 100 doses.  Dispense: 100 capsule; Refill: 0 - Chlamydia/Gonorrhea [facility] - Gonococcus culture - WET PREP FOR TRICH, YEAST, CLUE  2. Smoker 11-15 cpd Counseled via 5 A's to stop smoking  3. Marijuana use Last use today  4. HPV       Return if symptoms worsen or fail to improve.  No future appointments.  Alberteen Spindle, CNM

## 2019-09-15 NOTE — Progress Notes (Signed)
Wet [place] results reviewed. Per standing orders no treatment indicated. Sejla Marzano, RN  

## 2019-09-15 NOTE — Progress Notes (Signed)
Here today for STD screening. Accepts bloodwork. Cherrise Occhipinti, RN ° °

## 2019-09-20 LAB — GONOCOCCUS CULTURE

## 2019-09-23 NOTE — Addendum Note (Signed)
Addended by: Arnetha Courser on: 09/23/2019 04:03 PM   Modules accepted: Orders

## 2019-10-12 NOTE — Addendum Note (Signed)
Addended by: Geanie Berlin on: 10/12/2019 04:09 PM   Modules accepted: Orders

## 2019-12-16 ENCOUNTER — Ambulatory Visit (LOCAL_COMMUNITY_HEALTH_CENTER): Payer: Self-pay | Admitting: Physician Assistant

## 2019-12-16 ENCOUNTER — Other Ambulatory Visit: Payer: Self-pay

## 2019-12-16 ENCOUNTER — Encounter: Payer: Self-pay | Admitting: Physician Assistant

## 2019-12-16 VITALS — BP 117/76 | Ht 62.0 in | Wt 181.6 lb

## 2019-12-16 DIAGNOSIS — Z6829 Body mass index (BMI) 29.0-29.9, adult: Secondary | ICD-10-CM

## 2019-12-16 DIAGNOSIS — B9689 Other specified bacterial agents as the cause of diseases classified elsewhere: Secondary | ICD-10-CM

## 2019-12-16 DIAGNOSIS — F1290 Cannabis use, unspecified, uncomplicated: Secondary | ICD-10-CM

## 2019-12-16 DIAGNOSIS — F17200 Nicotine dependence, unspecified, uncomplicated: Secondary | ICD-10-CM

## 2019-12-16 DIAGNOSIS — Z1331 Encounter for screening for depression: Secondary | ICD-10-CM

## 2019-12-16 DIAGNOSIS — Z3009 Encounter for other general counseling and advice on contraception: Secondary | ICD-10-CM

## 2019-12-16 DIAGNOSIS — N760 Acute vaginitis: Secondary | ICD-10-CM

## 2019-12-16 LAB — WET PREP FOR TRICH, YEAST, CLUE
Trichomonas Exam: NEGATIVE
Yeast Exam: NEGATIVE

## 2019-12-16 MED ORDER — METRONIDAZOLE 500 MG PO TABS: 500.0000 mg | ORAL_TABLET | Freq: Two times a day (BID) | ORAL | 0 refills | Status: AC

## 2019-12-16 NOTE — Progress Notes (Signed)
Requesting PE. States that she had last pap 03/2019 at [facility]. Requesting STD screening.

## 2019-12-16 NOTE — Progress Notes (Signed)
Pt requesting requesting tx for BV. Now reports itching, odor, in which she didn't report before. Per provider, will treat per standing order.  Sharlyne Pacas, RN

## 2019-12-16 NOTE — Progress Notes (Signed)
[facility] [address] Main Number: [phone]    Family Planning Visit- Initial Visit  Subjective:  Susan Shelton is a 29 y.o.  No obstetric history on file.   being seen today for an initial well woman visit and to discuss family planning options.  She is currently using Condoms for pregnancy prevention. Patient reports she does not want a pregnancy in the next year.  Patient has the following medical conditions has Adult BMI 29.0-29.9 kg/sq m; Migraine without aura and without status migrainosus, not intractable; Smoker 11-15 cpd; Marijuana use; HPV ; and Positive depression screening on their problem list.  Chief Complaint  Patient presents with  . Gynecologic Exam    PE, Pap    Patient reports she feels well today except depressed mood that she attributes to the recent death of her mother and her recent job loss. Only med is occ boric acid vaginal suppositories, usually for postmenstrual odor for 3 days.  Patient denies SI/HI.   Body mass index is 33.22 kg/m. - Patient is eligible for diabetes screening based on BMI and age >42?  no HA1C ordered? not applicable  Patient reports 2 of partners in last year. Desires STI screening?  Yes  Has patient been screened once for HCV in the past?  No  No results found for: HCVAB  Does the patient have current drug use (including MJ), have a partner with drug use, and/or has been incarcerated since last result? Yes  If yes-- Screen for HCV through [facility]   Does the patient meet criteria for HBV testing? No  Criteria:  -Household, sexual or needle sharing contact with HBV -History of drug use -HIV positive -Those with known Hep C   Health Maintenance Due  Topic Date Due  . Hepatitis C Screening  Never done  . COVID-19 Vaccine (1) Never done  . TETANUS/TDAP  Never done  . PAP SMEAR-Modifier  04/30/2019    Review of Systems  Constitutional: Negative for  chills, diaphoresis, fever, malaise/fatigue and weight loss.  HENT: Negative.   Eyes: Negative.   Respiratory: Negative.   Cardiovascular: Negative.   Gastrointestinal: Negative.   Genitourinary: Negative.   Musculoskeletal: Negative.   Skin: Negative.   Neurological: Negative.   Endo/Heme/Allergies: Negative.   Psychiatric/Behavioral: Positive for depression and substance abuse. Negative for hallucinations, memory loss and suicidal ideas. The patient is not nervous/anxious and does not have insomnia.   All other systems reviewed and are negative. Pt describes wt gain over the last year.  The following portions of the patient's history were reviewed and updated as appropriate: allergies, current medications, past family history, past medical history, past social history, past surgical history and problem list. Problem list updated.   See flowsheet for other program required questions.  Objective:   Vitals:   12/16/19 1044  BP: 117/76  Weight: 181 lb 9.6 oz (82.4 kg)  Height: 5\' 2"  (1.575 m)    Physical Exam Constitutional:      Appearance: Normal appearance.  HENT:     Head: Normocephalic and atraumatic.  Cardiovascular:     Rate and Rhythm: Normal rate and regular rhythm.     Pulses: Normal pulses.     Heart sounds: Normal heart sounds.  Pulmonary:     Effort: Pulmonary effort is normal.     Breath sounds: Normal breath sounds.  Abdominal:     Palpations: Abdomen is soft.     Hernia: There  is no hernia in the left inguinal area or right inguinal area.  Genitourinary:    General: Normal vulva.     Exam position: Lithotomy position.     Pubic Area: No rash.      Labia:        Right: No tenderness or lesion.        Left: No tenderness or lesion.      Urethra: No urethral lesion.     Vagina: No signs of injury. No vaginal discharge, erythema, tenderness, bleeding or lesions.     Cervix: No cervical motion tenderness, discharge, friability, lesion, erythema or  cervical bleeding.     Uterus: Normal.      Adnexa:        Right: No mass or tenderness.         Left: No mass or tenderness.       Rectum: No external hemorrhoid.     Comments: Vag pH > 4.5 Musculoskeletal:        General: Normal range of motion.  Lymphadenopathy:     Cervical: No cervical adenopathy.     Lower Body: No right inguinal adenopathy. No left inguinal adenopathy.  Skin:    General: Skin is warm and dry.     Comments: Multiple tattoos  Neurological:     General: No focal deficit present.     Mental Status: She is alert.  Psychiatric:        Mood and Affect: Mood normal.        Behavior: Behavior normal.       Assessment and Plan:  WENDA VANSCHAICK is a 29 y.o. female presenting to the [facility] Department for an initial well woman exam/family planning visit  Contraception counseling: Reviewed all forms of birth control options in the tiered based approach. available including abstinence; over the counter/barrier methods; hormonal contraceptive medication including pill, patch, ring, injection,contraceptive implant, ECP; hormonal and nonhormonal IUDs; permanent sterilization options including vasectomy and the various tubal sterilization modalities. Risks, benefits, and typical effectiveness rates were reviewed.  Questions were answered.  Written information was also given to the patient to review.  Patient desires condoms, this was given to patient. She will follow up in  12 mo for surveillance.  She was told to call with any further questions, or with any concerns about this method of contraception.  Emphasized use of condoms 100% of the time for STI prevention.  1. Family planning services Continue condoms. RTC if desire more statistically reliable BCM. STI testing today. Desires referral to [facility] LCSW after abnormal depression screen. Clue cells and amine pos on wet prep. Pt originally did not c/o vaginal sx, but requests treatment. Meets criteria for BV  dx. Treat BV per S.O. - WET PREP FOR TRICH, YEAST, CLUE - Chlamydia/Gonorrhea Susan [facility]ton - Syphilis Serology, Susan [facility]ton - HIV/HCV Susan [facility]ton - IGP, rfx Aptima HPV ASCU  2. Smoker 11-15 cpd Counseled via 5As, [place] Quitline # given, pt not ready to quit in next 30 day.  3. Marijuana use Enc cessation.  4. Adult BMI 29.0-29.9 kg/sq m Enc diet/exercise     No follow-ups on file.  No future appointments.  Landry Dyke, PA-C

## 2019-12-18 LAB — IGP, RFX APTIMA HPV ASCU: PAP Smear Comment: 0

## 2019-12-22 LAB — HM HIV SCREENING LAB: HM HIV Screening: NEGATIVE

## 2019-12-22 LAB — HM HEPATITIS C SCREENING LAB: HM Hepatitis Screen: NEGATIVE

## 2019-12-24 ENCOUNTER — Encounter: Payer: Self-pay | Admitting: Family Medicine

## 2020-01-13 ENCOUNTER — Ambulatory Visit: Payer: Self-pay | Admitting: Licensed Clinical Social Worker

## 2020-02-09 ENCOUNTER — Ambulatory Visit: Payer: Self-pay | Admitting: Licensed Clinical Social Worker

## 2020-04-02 ENCOUNTER — Emergency Department
Admission: EM | Admit: 2020-04-02 | Discharge: 2020-04-02 | Disposition: A | Discharge status: Home / Self Care | Payer: Self-pay | Attending: Emergency Medicine | Admitting: Emergency Medicine

## 2020-04-02 ENCOUNTER — Emergency Department: Payer: Self-pay

## 2020-04-02 DIAGNOSIS — F17210 Nicotine dependence, cigarettes, uncomplicated: Secondary | ICD-10-CM | POA: Insufficient documentation

## 2020-04-02 DIAGNOSIS — I10 Essential (primary) hypertension: Secondary | ICD-10-CM | POA: Insufficient documentation

## 2020-04-02 DIAGNOSIS — R0789 Other chest pain: Secondary | ICD-10-CM | POA: Insufficient documentation

## 2020-04-02 DIAGNOSIS — J40 Bronchitis, not specified as acute or chronic: Secondary | ICD-10-CM | POA: Insufficient documentation

## 2020-04-02 DIAGNOSIS — Z20822 Contact with and (suspected) exposure to covid-19: Secondary | ICD-10-CM | POA: Insufficient documentation

## 2020-04-02 LAB — CBC
HCT: 38 % (ref 36.0–46.0)
Hemoglobin: 12.1 g/dL (ref 12.0–15.0)
MCH: 27.1 pg (ref 26.0–34.0)
MCHC: 31.8 g/dL (ref 30.0–36.0)
MCV: 85 fL (ref 80.0–100.0)
Platelets: 331 10*3/uL (ref 150–400)
RBC: 4.47 MIL/uL (ref 3.87–5.11)
RDW: 13.2 % (ref 11.5–15.5)
WBC: 10.2 10*3/uL (ref 4.0–10.5)
nRBC: 0 % (ref 0.0–0.2)

## 2020-04-02 LAB — BASIC METABOLIC PANEL
Anion gap: 8 (ref 5–15)
BUN: 9 mg/dL (ref 6–20)
CO2: 25 mmol/L (ref 22–32)
Calcium: 9.2 mg/dL (ref 8.9–10.3)
Chloride: 103 mmol/L (ref 98–111)
Creatinine, Ser: 0.76 mg/dL (ref 0.44–1.00)
GFR, Estimated: >60 mL/min (ref >60)
Glucose, Bld: 141 mg/dL — ABNORMAL HIGH (ref 70–99)
Potassium: 3.4 mmol/L — ABNORMAL LOW (ref 3.5–5.1)
Sodium: 136 mmol/L (ref 135–145)

## 2020-04-02 LAB — TROPONIN I (HIGH SENSITIVITY)
Troponin I (High Sensitivity): 2 ng/L (ref <18)
Troponin I (High Sensitivity): <2 ng/L (ref <18)

## 2020-04-02 LAB — RESPIRATORY PANEL BY RT PCR (FLU A&B, COVID)
Influenza A by PCR: NEGATIVE
Influenza B by PCR: NEGATIVE
SARS Coronavirus 2 by RT PCR: NEGATIVE

## 2020-04-02 MED ORDER — ALBUTEROL SULFATE HFA 108 (90 BASE) MCG/ACT IN AERS: 2.0000 | INHALATION_SPRAY | Freq: Four times a day (QID) | RESPIRATORY_TRACT | 0 refills | Status: DC | PRN

## 2020-04-02 MED ORDER — AZITHROMYCIN 250 MG PO TABS
ORAL_TABLET | ORAL | 0 refills | Status: DC
Start: 2020-04-02 — End: 2020-07-28

## 2020-04-02 MED ORDER — DEXAMETHASONE 4 MG PO TABS
10.0000 mg | ORAL_TABLET | Freq: Once | ORAL | Status: AC
  Administered 2020-04-02: 10 mg via ORAL
  Filled 2020-04-02: qty 2.5

## 2020-04-02 MED ORDER — BENZONATATE 100 MG PO CAPS: 100.0000 mg | ORAL_CAPSULE | Freq: Three times a day (TID) | ORAL | 0 refills | Status: DC | PRN

## 2020-04-02 MED ORDER — IPRATROPIUM-ALBUTEROL 0.5-2.5 (3) MG/3ML IN SOLN
3.0000 mL | Freq: Once | RESPIRATORY_TRACT | Status: AC
  Administered 2020-04-02: 3 mL via RESPIRATORY_TRACT
  Filled 2020-04-02: qty 3

## 2020-04-02 NOTE — ED Notes (Signed)
Awaiting decadron from pharmacy

## 2020-04-02 NOTE — ED Triage Notes (Signed)
Patient to ED for chest pain and cough. Patient states productive cough. Denies fever at home.

## 2020-04-02 NOTE — ED Notes (Signed)
Pt reports that she has had a "chest cold" x4-5 days with some congestion Pt reports that yesterday around noon she started with mid chest pressure that did not resolve so she came to the ED Pt denies radiation of pain, SHOB, N/V

## 2020-04-02 NOTE — ED Provider Notes (Signed)
[facility] Emergency Department Provider Note  ____________________________________________   First MD Initiated Contact with Patient 04/02/20 1057     (approximate)  I have reviewed the triage vital signs and the nursing notes.   HISTORY  Chief Complaint Chest Pain    HPI Susan Shelton is a 29 y.o. female with history of tobacco use, hypertension, migraines, here with cough, shortness of breath, runny nose.  The patient states that her symptoms started approximately 2 to 3 days ago.  She states that she had been out for Halloween, and that her daughter, who was also out, developed cough and congestion shortly thereafter.  She states that she accidentally shared food with her daughter several days ago.  Since then, she has had nasal congestion, mild sore throat, cough, and occasional shortness of breath.  The shortness of breath is fairly constant.  She is also had some dull, substernal chest pressure which is been constant for at least the last 24 hours.  This is worse after coughing.  No pain with inspiration.  No leg swelling.  No history of DVT or PE.  No history of coronary disease.  Her shortness of breath is fairly constant, worse with exertion.  She does note that she smokes and occasionally has noticed some wheezing.  No history of asthma or COPD.        Past Medical History:  Diagnosis Date  . Bacterial vaginosis   . Hypertension   . Migraines     Patient Active Problem List   Diagnosis Date Noted  . Positive depression screening 12/16/2019  . Smoker 11-15 cpd 09/15/2019  . Marijuana use 09/15/2019  . HPV  09/15/2019  . Adult BMI 29.0-29.9 kg/sq m 02/24/2015  . Migraine without aura and without status migrainosus, not intractable 03/15/2014    Past Surgical History:  Procedure Laterality Date  . ABDOMINAL SURGERY      Prior to Admission medications   Medication Sig Start Date End Date Taking? Authorizing Provider  omeprazole  (PRILOSEC) 20 MG capsule Take 1 capsule (20 mg total) by mouth daily. 03/28/19 05/27/19  Chesley Noon, MD  ondansetron (ZOFRAN ODT) 4 MG disintegrating tablet Take 1 tablet (4 mg total) by mouth every 8 (eight) hours as needed for nausea or vomiting. Patient not taking: Reported on 09/15/2019 03/28/19   Chesley Noon, MD    Allergies Doxycycline  Family History  Problem Relation Age of Onset  . Congestive Heart Failure Mother   . Diabetes Mother   . Hypertension Mother   . Hepatitis Mother   . Bipolar disorder Mother   . Schizophrenia Mother   . Hypertension Father   . Asthma Father   . Bipolar disorder Brother   . Schizophrenia Brother   . Diabetes Maternal Grandmother   . Stroke Maternal Grandmother   . Hypertension Paternal Grandmother     Social History Social History   Tobacco Use  . Smoking status: Current Every Day Smoker    Packs/day: 1.00    Types: Cigarettes  . Smokeless tobacco: Never Used  Substance Use Topics  . Alcohol use: No  . Drug use: Not on file    Review of Systems  Review of Systems  Constitutional: Positive for fatigue. Negative for fever.  HENT: Positive for rhinorrhea and sore throat. Negative for congestion.   Eyes: Negative for visual disturbance.  Respiratory: Positive for cough and shortness of breath.   Cardiovascular: Positive for chest pain.  Gastrointestinal: Negative for abdominal pain, diarrhea,  nausea and vomiting.  Genitourinary: Negative for flank pain.  Musculoskeletal: Negative for back pain and neck pain.  Skin: Negative for rash and wound.  Neurological: Negative for weakness.  All other systems reviewed and are negative.    ____________________________________________  PHYSICAL EXAM:      VITAL SIGNS: ED Triage Vitals  Enc Vitals Group     BP 04/02/20 1012 (!) 146/77     Pulse Rate 04/02/20 1012 85     Resp 04/02/20 1012 14     Temp --      Temp src --      SpO2 04/02/20 1012 98 %     Weight 04/02/20 0817  185 lb (83.9 kg)     Height 04/02/20 0817 5\' 2"  (1.575 m)     Head Circumference --      Peak Flow --      Pain Score 04/02/20 0817 5     Pain Loc --      Pain Edu? --      Excl. in GC? --      Physical Exam Vitals and nursing note reviewed.  Constitutional:      General: She is not in acute distress.    Appearance: She is well-developed.  HENT:     Head: Normocephalic and atraumatic.  Eyes:     Conjunctiva/sclera: Conjunctivae normal.  Cardiovascular:     Rate and Rhythm: Normal rate and regular rhythm.     Heart sounds: Normal heart sounds. No murmur heard.  No friction rub.  Pulmonary:     Effort: Pulmonary effort is normal. No respiratory distress.     Breath sounds: Wheezing (Scant, and expiratory only) present. No rales.  Abdominal:     General: There is no distension.     Palpations: Abdomen is soft.     Tenderness: There is no abdominal tenderness.  Musculoskeletal:     Cervical back: Neck supple.  Skin:    General: Skin is warm.     Capillary Refill: Capillary refill takes less than 2 seconds.  Neurological:     Mental Status: She is alert and oriented to person, place, and time.     Motor: No abnormal muscle tone.       ____________________________________________   LABS (all labs ordered are listed, but only abnormal results are displayed)  Labs Reviewed  BASIC METABOLIC PANEL - Abnormal; Notable for the following components:      Result Value   Potassium 3.4 (*)    Glucose, Bld 141 (*)    All other components within normal limits  RESPIRATORY PANEL BY RT PCR (FLU A&B, COVID)  CBC  POC URINE PREG, ED  TROPONIN I (HIGH SENSITIVITY)  TROPONIN I (HIGH SENSITIVITY)    ____________________________________________  EKG: EKG: Normal sinus rhythm 90, ventricular rate 150, QRS 74, QTc 423.  Nonspecific T wave changes, no acute ST elevations or depressions.  T wave changes more evident than 2013, though noted in the inferior leads at that  time. ________________________________________  RADIOLOGY All imaging, including plain films, CT scans, and ultrasounds, independently reviewed by me, and interpretations confirmed via formal radiology reads.  ED MD interpretation:   Chest x-ray: Clear, no active disease  Official radiology report(s): DG Chest 2 View  Result Date: 04/02/2020 CLINICAL DATA:  Patient presents for chest pain and cough. EXAM: CHEST - 2 VIEW COMPARISON:  Chest radiograph 11/04/2016 FINDINGS: The cardiomediastinal contours are within normal limits. The lungs are clear. No pneumothorax or pleural effusion. No  acute finding in the visualized skeleton. IMPRESSION: No active cardiopulmonary disease. Electronically Signed   By: Emmaline Kluver M.D.   On: 04/02/2020 09:15    ____________________________________________  PROCEDURES   Procedure(s) performed (including Critical Care):  Procedures  ____________________________________________  INITIAL IMPRESSION / MDM / ASSESSMENT AND PLAN / ED COURSE  As part of my medical decision making, I reviewed the following data within the electronic MEDICAL RECORD NUMBER Nursing notes reviewed and incorporated, Old chart reviewed, Notes from prior ED visits, and [place] Controlled Substance Database       *Susan Shelton was evaluated in Emergency Department on 04/02/2020 for the symptoms described in the history of present illness. She was evaluated in the context of the global COVID-19 pandemic, which necessitated consideration that the patient might be at risk for infection with the SARS-CoV-2 virus that causes COVID-19. Institutional protocols and algorithms that pertain to the evaluation of patients at risk for COVID-19 are in a state of rapid change based on information released by regulatory bodies including the CDC and federal and state organizations. These policies and algorithms were followed during the patient's care in the ED.  Some ED evaluations and interventions  may be delayed as a result of limited staffing during the pandemic.*     Medical Decision Making: 29 year old female here with cough, congestion, atypical chest pain.  Regarding her chest pain, EKG shows some nonspecific T wave changes which I suspect are related to her underlying hypertension.  She has negative troponins x2 despite constant symptoms and low risk heart score, making ACS unlikely.  Her chest x-ray is clear without evidence of pneumonia upon my review.  Otherwise, lab work is very reassuring.  She has a known sick contact and her daughter who has had similar symptoms.  Suspect URI/bronchitis, with possible component of reactive airway disease given her smoking history.  Will treat her with a dose of Decadron, albuterol, and outpatient azithromycin.  Return precautions given.  ____________________________________________  FINAL CLINICAL IMPRESSION(S) / ED DIAGNOSES  Final diagnoses:  Bronchitis  Atypical chest pain     MEDICATIONS GIVEN DURING THIS VISIT:  Medications  dexamethasone (DECADRON) tablet 10 mg (has no administration in time range)  ipratropium-albuterol (DUONEB) 0.5-2.5 (3) MG/3ML nebulizer solution 3 mL (3 mLs Nebulization Given 04/02/20 1135)     ED Discharge Orders    None       Note:  This document was prepared using Dragon voice recognition software and may include unintentional dictation errors.   Shaune Pollack, MD 04/02/20 1228

## 2020-06-23 ENCOUNTER — Ambulatory Visit: Payer: Self-pay | Admitting: Family Medicine

## 2020-06-23 ENCOUNTER — Encounter: Payer: Self-pay | Admitting: Family Medicine

## 2020-06-23 ENCOUNTER — Other Ambulatory Visit: Payer: Self-pay

## 2020-06-23 DIAGNOSIS — Z113 Encounter for screening for infections with a predominantly sexual mode of transmission: Secondary | ICD-10-CM

## 2020-06-23 DIAGNOSIS — B379 Candidiasis, unspecified: Secondary | ICD-10-CM

## 2020-06-23 LAB — WET PREP FOR TRICH, YEAST, CLUE
Trichomonas Exam: NEGATIVE
Yeast Exam: NEGATIVE

## 2020-06-23 MED ORDER — CLOTRIMAZOLE 1 % VA CREA: 1.0000 | TOPICAL_CREAM | Freq: Every day | VAGINAL | 0 refills | Status: AC

## 2020-06-23 MED ORDER — METRONIDAZOLE 500 MG PO TABS: 500.0000 mg | ORAL_TABLET | Freq: Two times a day (BID) | ORAL | 0 refills | Status: AC

## 2020-06-23 NOTE — Progress Notes (Signed)
[facility] Department STI clinic/screening visit  Subjective:  Susan Shelton is a 30 y.o. female being seen today for an STI screening visit. The patient reports they do have symptoms.  Patient reports that they do desire a pregnancy in the next year.   They reported they are not interested in discussing contraception today.  Patient's last menstrual period was 06/04/2020.   Patient has the following medical conditions:   Patient Active Problem List   Diagnosis Date Noted  . Positive depression screening 12/16/2019  . Smoker 11-15 cpd 09/15/2019  . Marijuana use 09/15/2019  . HPV  09/15/2019  . Adult BMI 29.0-29.9 kg/sq m 02/24/2015  . Migraine without aura and without status migrainosus, not intractable 03/15/2014    Chief Complaint  Patient presents with  . SEXUALLY TRANSMITTED DISEASE    Screening     HPI  Patient reports having discharge and odor x 1 week   Last HIV test per patient/review of record was 11/2019 Patient reports last pap was 7/20210  See flowsheet for further details and programmatic requirements.    The following portions of the patient's history were reviewed and updated as appropriate: allergies, current medications, past medical history, past social history, past surgical history and problem list.  Objective:  There were no vitals filed for this visit.  Physical Exam Vitals and nursing note reviewed.  Constitutional:      Appearance: Normal appearance.  HENT:     Head: Normocephalic and atraumatic.     Mouth/Throat:     Mouth: Mucous membranes are moist.     Pharynx: Oropharynx is clear. No oropharyngeal exudate or posterior oropharyngeal erythema.  Pulmonary:     Effort: Pulmonary effort is normal.  Chest:  Breasts:     Right: No axillary adenopathy or supraclavicular adenopathy.     Left: No axillary adenopathy or supraclavicular adenopathy.    Abdominal:     Palpations: Abdomen is soft. There is no mass.      Tenderness: There is no abdominal tenderness. There is no rebound.  Genitourinary:    General: Normal vulva.     Exam position: Lithotomy position.     Pubic Area: No rash or pubic lice.      Labia:        Right: No rash or lesion.        Left: No rash or lesion.      Vagina: Normal. No vaginal discharge, erythema, bleeding or lesions.     Cervix: No cervical motion tenderness, discharge, friability, lesion or erythema.     Uterus: Normal.      Adnexa: Right adnexa normal and left adnexa normal.     Rectum: Normal.     Comments: External genitalia without, lice, nits, erythema, edema , lesions or inguinal adenopathy. Vagina with normal mucosa and thick white discharge and pH >4.  Cervix without visual lesions, uterus firm, mobile, non-tender, no masses, CMT adnexal fullness or tenderness.   Musculoskeletal:     Cervical back: Normal range of motion and neck supple.  Lymphadenopathy:     Head:     Right side of head: No preauricular or posterior auricular adenopathy.     Left side of head: No preauricular or posterior auricular adenopathy.     Cervical: No cervical adenopathy.     Upper Body:     Right upper body: No supraclavicular or axillary adenopathy.     Left upper body: No supraclavicular or axillary adenopathy.     Lower  Body: No right inguinal adenopathy. No left inguinal adenopathy.  Skin:    General: Skin is warm and dry.     Findings: No rash.  Neurological:     Mental Status: She is alert and oriented to person, place, and time.  Psychiatric:        Mood and Affect: Mood normal.        Behavior: Behavior normal.      Assessment and Plan:  Susan Shelton is a 30 y.o. female presenting to the [facility] Department for STI screening  1. Screening examination for venereal disease  - WET PREP FOR TRICH, YEAST, CLUE  Patient accepted screenings for wet prep declined all other testing.   Patient meets criteria for HepB screening? Yes. Ordered? No -  declines testing , tested in last 6 months  Patient meets criteria for HepC screening? Yes. Ordered? No - declines testing, tested in last 6 months   Treat Wet prep if +amine or clue, with metronidazole 500 mg PO BID x 7 days.  Treatment needed for yeast with clotrimazole  1% vaginal cream  Discussed time line for State Lab results and that patient will be called with positive results and encouraged patient to call if she had not heard in 2 weeks.  Counseled to return or seek care for continued or worsening symptoms Recommended condom use with all sex  Patient is currently not using method to prevent pregnancy, desires pregnancy .         Return for as needed.  No future appointments.  Wendi Snipes, FNP

## 2020-06-23 NOTE — Progress Notes (Signed)
Post:  RN reviewed wet mount with patient. Patient treated for BV and yeast per provider order. Provider orders complete.  Harvie Heck, RN

## 2020-07-28 ENCOUNTER — Encounter: Payer: Self-pay | Admitting: Emergency Medicine

## 2020-07-28 ENCOUNTER — Other Ambulatory Visit: Payer: Self-pay

## 2020-07-28 ENCOUNTER — Emergency Department
Admission: EM | Admit: 2020-07-28 | Discharge: 2020-07-28 | Disposition: A | Discharge status: Home / Self Care | Payer: Self-pay | Attending: Emergency Medicine | Admitting: Emergency Medicine

## 2020-07-28 DIAGNOSIS — Z20822 Contact with and (suspected) exposure to covid-19: Secondary | ICD-10-CM | POA: Insufficient documentation

## 2020-07-28 DIAGNOSIS — F17210 Nicotine dependence, cigarettes, uncomplicated: Secondary | ICD-10-CM | POA: Insufficient documentation

## 2020-07-28 DIAGNOSIS — J069 Acute upper respiratory infection, unspecified: Secondary | ICD-10-CM | POA: Insufficient documentation

## 2020-07-28 DIAGNOSIS — I10 Essential (primary) hypertension: Secondary | ICD-10-CM | POA: Insufficient documentation

## 2020-07-28 MED ORDER — PSEUDOEPH-BROMPHEN-DM 30-2-10 MG/5ML PO SYRP: 5.0000 mL | ORAL_SOLUTION | Freq: Four times a day (QID) | ORAL | 0 refills | Status: DC | PRN

## 2020-07-28 NOTE — ED Provider Notes (Signed)
[facility] Emergency Department Provider Note  ____________________________________________   Event Date/Time   First MD Initiated Contact with Patient 07/28/20 1301     (approximate)  I have reviewed the triage vital signs and the nursing notes.   HISTORY  Chief Complaint No chief complaint on file.   HPI Susan Shelton is a 30 y.o. female presents to the ED with complaint of cough and congestion that started 4 days ago with rhinorrhea beginning 2 days ago.  Patient states that she also has developed a productive yellow cough.  She is unaware of any fever but states that she does have chills at times.  Patient denies any nausea, vomiting or diarrhea.  She has not been vaccinated.       Past Medical History:  Diagnosis Date  . Bacterial vaginosis   . Hypertension   . Migraines     Patient Active Problem List   Diagnosis Date Noted  . Positive depression screening 12/16/2019  . Smoker 11-15 cpd 09/15/2019  . Marijuana use 09/15/2019  . HPV  09/15/2019  . Adult BMI 29.0-29.9 kg/sq m 02/24/2015  . Migraine without aura and without status migrainosus, not intractable 03/15/2014    Past Surgical History:  Procedure Laterality Date  . ABDOMINAL SURGERY      Prior to Admission medications   Medication Sig Start Date End Date Taking? Authorizing Provider  brompheniramine-pseudoephedrine-DM 30-2-10 MG/5ML syrup Take 5 mLs by mouth 4 (four) times daily as needed. 07/28/20  Yes Tommi Rumps, PA-C  albuterol (VENTOLIN HFA) 108 (90 Base) MCG/ACT inhaler Inhale 2 puffs into the lungs every 6 (six) hours as needed for wheezing or shortness of breath. 04/02/20   Shaune Pollack, MD  omeprazole (PRILOSEC) 20 MG capsule Take 1 capsule (20 mg total) by mouth daily. 03/28/19 05/27/19  Chesley Noon, MD    Allergies Doxycycline  Family History  Problem Relation Age of Onset  . Congestive Heart Failure Mother   . Diabetes Mother   . Hypertension  Mother   . Hepatitis Mother   . Bipolar disorder Mother   . Schizophrenia Mother   . Hypertension Father   . Asthma Father   . Bipolar disorder Brother   . Schizophrenia Brother   . Diabetes Maternal Grandmother   . Stroke Maternal Grandmother   . Hypertension Paternal Grandmother     Social History Social History   Tobacco Use  . Smoking status: Current Every Day Smoker    Packs/day: 0.50    Types: Cigarettes  . Smokeless tobacco: Never Used  Vaping Use  . Vaping Use: Never used  Substance Use Topics  . Alcohol use: Yes    Comment: socially   . Drug use: Yes    Frequency: 7.0 times per week    Types: Marijuana    Review of Systems Constitutional: No fever/positive chills Eyes: No visual changes. ENT: No sore throat.  Positive rhinorrhea.   Cardiovascular: Denies chest pain. Respiratory: Denies shortness of breath.  Positive for cough. Gastrointestinal: No abdominal pain.  No nausea, no vomiting.  No diarrhea.  Genitourinary: Negative for dysuria. Musculoskeletal: Positive for body aches. Skin: Negative for rash. Neurological: Negative for headaches, focal weakness or numbness. ____________________________________________   PHYSICAL EXAM:  VITAL SIGNS: ED Triage Vitals  Enc Vitals Group     BP 07/28/20 1235 115/78     Pulse Rate 07/28/20 1235 85     Resp 07/28/20 1235 18     Temp 07/28/20 1235 98.5 F (  36.9 C)     Temp Source 07/28/20 1235 Oral     SpO2 07/28/20 1235 98 %     Weight 07/28/20 1205 184 lb 15.5 oz (83.9 kg)     Height 07/28/20 1205 5\' 2"  (1.575 m)     Head Circumference --      Peak Flow --      Pain Score --      Pain Loc --      Pain Edu? --      Excl. in GC? --     Constitutional: Alert and oriented. Well appearing and in no acute distress. Eyes: Conjunctivae are normal.  Head: Atraumatic. Nose: Moderate congestion/rhinnorhea. Neck: No stridor.   Cardiovascular: Normal rate, regular rhythm. Grossly normal heart sounds.  Good  peripheral circulation. Respiratory: Normal respiratory effort.  No retractions. Lungs CTAB. Gastrointestinal: Soft and nontender. No distention.  Musculoskeletal: Moves upper and lower extremities that any difficulty.  Normal gait was noted. Neurologic:  Normal speech and language. No gross focal neurologic deficits are appreciated.  Skin:  Skin is warm, dry and intact. No rash noted. Psychiatric: Mood and affect are normal. Speech and behavior are normal.  ____________________________________________   LABS (all labs ordered are listed, but only abnormal results are displayed)  Labs Reviewed  SARS CORONAVIRUS 2 (TAT 6-24 HRS)    PROCEDURES  Procedure(s) performed (including Critical Care):  Procedures   ____________________________________________   INITIAL IMPRESSION / ASSESSMENT AND PLAN / ED COURSE  As part of my medical decision making, I reviewed the following data within the electronic MEDICAL RECORD NUMBER Notes from prior ED visits and [place] Controlled Substance Database  30 year old female presents to the ED with complaint of URI symptoms that began 4 days ago and has progressed to rhinorrhea and productive cough.  Patient complains of chills and body aches but is unaware of any fever.  Patient is unvaccinated.  A prescription for Bromfed-DM was sent to the pharmacy.  Patient is aware that she can see the results of her Covid test on MyChart and that if it is positive she will need to quarantine for 10 days.  She is encouraged to drink fluids and take Tylenol or ibuprofen for her body aches and symptoms related to this.  ____________________________________________   FINAL CLINICAL IMPRESSION(S) / ED DIAGNOSES  Final diagnoses:  Viral URI with cough     ED Discharge Orders         Ordered    brompheniramine-pseudoephedrine-DM 30-2-10 MG/5ML syrup  4 times daily PRN        07/28/20 1356          *Please note:  Susan Shelton was evaluated in Emergency  Department on 07/28/2020 for the symptoms described in the history of present illness. She was evaluated in the context of the global COVID-19 pandemic, which necessitated consideration that the patient might be at risk for infection with the SARS-CoV-2 virus that causes COVID-19. Institutional protocols and algorithms that pertain to the evaluation of patients at risk for COVID-19 are in a state of rapid change based on information released by regulatory bodies including the CDC and federal and state organizations. These policies and algorithms were followed during the patient's care in the ED.  Some ED evaluations and interventions may be delayed as a result of limited staffing during and the pandemic.*   Note:  This document was prepared using Dragon voice recognition software and may include unintentional dictation errors.    09/27/2020, PA-C  07/28/20 1408    Jene Every, MD 07/28/20 [phone]

## 2020-07-28 NOTE — Discharge Instructions (Signed)
Follow-up with your primary care provider if any continued problems.  Bromfed-DM as needed for cough and congestion.  Increase fluids.  You may take Tylenol or ibuprofen as needed.  The results of your COVID test can be seen on MyChart.  If your test results are positive because you have not been vaccinated you will need to be quarantined for 10 days including anyone that you live with.

## 2020-07-28 NOTE — ED Notes (Signed)
See triage note     Presents with cough for 4 days  Then runny nose 2 days ago   States her cough is productive at times  Yellowish phlegm

## 2020-07-28 NOTE — ED Triage Notes (Signed)
Arrives c/o cough x 4 days, runny nose x 2 days and headache.  Patient is AAOx3.  Skin warm and dry. No SOB/ DOE

## 2020-07-29 LAB — SARS CORONAVIRUS 2 (TAT 6-24 HRS): SARS Coronavirus 2: NEGATIVE

## 2020-10-11 ENCOUNTER — Encounter: Payer: Self-pay | Admitting: Emergency Medicine

## 2020-10-11 ENCOUNTER — Emergency Department
Admission: EM | Admit: 2020-10-11 | Discharge: 2020-10-11 | Disposition: A | Discharge status: Home / Self Care | Payer: Self-pay | Attending: Emergency Medicine | Admitting: Emergency Medicine

## 2020-10-11 ENCOUNTER — Other Ambulatory Visit: Payer: Self-pay

## 2020-10-11 DIAGNOSIS — G43909 Migraine, unspecified, not intractable, without status migrainosus: Secondary | ICD-10-CM | POA: Insufficient documentation

## 2020-10-11 DIAGNOSIS — M545 Low back pain, unspecified: Secondary | ICD-10-CM | POA: Insufficient documentation

## 2020-10-11 DIAGNOSIS — Z5321 Procedure and treatment not carried out due to patient leaving prior to being seen by health care provider: Secondary | ICD-10-CM | POA: Insufficient documentation

## 2020-10-11 LAB — URINALYSIS, COMPLETE (UACMP) WITH MICROSCOPIC
Bacteria, UA: NONE SEEN
Bilirubin Urine: NEGATIVE
Glucose, UA: NEGATIVE mg/dL
Ketones, ur: NEGATIVE mg/dL
Leukocytes,Ua: NEGATIVE
Nitrite: NEGATIVE
Protein, ur: NEGATIVE mg/dL
Specific Gravity, Urine: 1.018 (ref 1.005–1.030)
Squamous Epithelial / HPF: NONE SEEN (ref 0–5)
WBC, UA: NONE SEEN WBC/hpf (ref 0–5)
pH: 5 (ref 5.0–8.0)

## 2020-10-11 LAB — POC URINE PREG, ED: Preg Test, Ur: NEGATIVE

## 2020-10-11 NOTE — ED Triage Notes (Signed)
Pt arrived via POV with reports of low back pain states she thinks she pulled a muscle. Pt also c/o migraine HA, states she has hx of migraines and feels like normal migraine. Pt states sxs began around 10pm last night, states she took excedrin, was unable to get any rest due to discomfort.  Pt endorses light sensitivity and nausea at times.

## 2020-10-11 NOTE — ED Notes (Signed)
Pt called in the WR with no response, Screener states she witnessed the pt getting in to a car and leaving

## 2020-10-12 ENCOUNTER — Ambulatory Visit: Payer: Self-pay

## 2020-10-16 ENCOUNTER — Telehealth: Payer: Self-pay | Admitting: Family Medicine

## 2020-10-16 ENCOUNTER — Ambulatory Visit: Payer: Self-pay

## 2020-10-16 NOTE — Telephone Encounter (Signed)
Pt has an STI appointment for this afternoon and she is about to get her cycle, so she does not want to cancel the appointment. BUT has Covid symptoms. Please call her.

## 2020-10-16 NOTE — Telephone Encounter (Signed)
Phone call to pt. Left message on voicemail encouraging pt to get Covid testing, if positive would want to quarantine for appropriate amount of time, if negative will want to get her back on the schedule also. For appt, can call [phone].  (No record of Covid immunizations in Epic or [place]

## 2020-10-16 NOTE — Telephone Encounter (Signed)
Phone call to pt. Left message on voicemail that some tests may be affected due to bleeding and others may not.  Also, should be screened for Covid if you know you are having Covid symptoms.

## 2020-10-30 ENCOUNTER — Other Ambulatory Visit: Payer: Self-pay

## 2020-10-30 ENCOUNTER — Emergency Department
Admission: EM | Admit: 2020-10-30 | Discharge: 2020-10-30 | Disposition: A | Discharge status: Home / Self Care | Payer: Self-pay | Attending: Emergency Medicine | Admitting: Emergency Medicine

## 2020-10-30 DIAGNOSIS — I10 Essential (primary) hypertension: Secondary | ICD-10-CM | POA: Insufficient documentation

## 2020-10-30 DIAGNOSIS — F17210 Nicotine dependence, cigarettes, uncomplicated: Secondary | ICD-10-CM | POA: Insufficient documentation

## 2020-10-30 DIAGNOSIS — H6691 Otitis media, unspecified, right ear: Secondary | ICD-10-CM | POA: Insufficient documentation

## 2020-10-30 DIAGNOSIS — H6690 Otitis media, unspecified, unspecified ear: Secondary | ICD-10-CM

## 2020-10-30 DIAGNOSIS — Z8616 Personal history of COVID-19: Secondary | ICD-10-CM | POA: Insufficient documentation

## 2020-10-30 MED ORDER — AMOXICILLIN 500 MG PO CAPS: 500.0000 mg | ORAL_CAPSULE | Freq: Two times a day (BID) | ORAL | 0 refills | Status: DC

## 2020-10-30 MED ORDER — AMOXICILLIN 500 MG PO CAPS
500.0000 mg | ORAL_CAPSULE | Freq: Once | ORAL | Status: AC
  Administered 2020-10-30: 500 mg via ORAL
  Filled 2020-10-30: qty 1

## 2020-10-30 NOTE — ED Provider Notes (Signed)
[facility] Emergency Department Provider Note   ____________________________________________    I have reviewed the triage vital signs and the nursing notes.   HISTORY  Chief Complaint Ear Fullness     HPI Susan Shelton is a 30 y.o. female who presents with complaints of right ear pain and a sense of fullness.  Patient reports she had COVID several weeks ago, the symptoms developed over the last 2 days.  No fevers reported.  No headache.  No sore throat or cough.  Past Medical History:  Diagnosis Date  . Bacterial vaginosis   . Hypertension   . Migraines     Patient Active Problem List   Diagnosis Date Noted  . Positive depression screening 12/16/2019  . Smoker 11-15 cpd 09/15/2019  . Marijuana use 09/15/2019  . HPV  09/15/2019  . Adult BMI 29.0-29.9 kg/sq m 02/24/2015  . Migraine without aura and without status migrainosus, not intractable 03/15/2014    Past Surgical History:  Procedure Laterality Date  . ABDOMINAL SURGERY      Prior to Admission medications   Medication Sig Start Date End Date Taking? Authorizing Provider  amoxicillin (AMOXIL) 500 MG capsule Take 1 capsule (500 mg total) by mouth 2 (two) times daily. 10/30/20  Yes Jene Every, MD  albuterol (VENTOLIN HFA) 108 (90 Base) MCG/ACT inhaler Inhale 2 puffs into the lungs every 6 (six) hours as needed for wheezing or shortness of breath. 04/02/20   Shaune Pollack, MD  brompheniramine-pseudoephedrine-DM 30-2-10 MG/5ML syrup Take 5 mLs by mouth 4 (four) times daily as needed. 07/28/20   Tommi Rumps, PA-C  omeprazole (PRILOSEC) 20 MG capsule Take 1 capsule (20 mg total) by mouth daily. 03/28/19 05/27/19  Chesley Noon, MD     Allergies Doxycycline  Family History  Problem Relation Age of Onset  . Congestive Heart Failure Mother   . Diabetes Mother   . Hypertension Mother   . Hepatitis Mother   . Bipolar disorder Mother   . Schizophrenia Mother   .  Hypertension Father   . Asthma Father   . Bipolar disorder Brother   . Schizophrenia Brother   . Diabetes Maternal Grandmother   . Stroke Maternal Grandmother   . Hypertension Paternal Grandmother     Social History Social History   Tobacco Use  . Smoking status: Current Every Day Smoker    Packs/day: 0.50    Types: Cigarettes  . Smokeless tobacco: Never Used  Vaping Use  . Vaping Use: Never used  Substance Use Topics  . Alcohol use: Yes    Comment: socially   . Drug use: Yes    Frequency: 7.0 times per week    Types: Marijuana    Review of Systems  Constitutional: No fever/chills  ENT: As above    Musculoskeletal: Negative for joint pain Skin: Negative for rash. Neurological: Negative for headaches     ____________________________________________   PHYSICAL EXAM:  VITAL SIGNS: ED Triage Vitals  Enc Vitals Group     BP 10/30/20 2004 134/70     Pulse Rate 10/30/20 2004 68     Resp 10/30/20 2004 16     Temp 10/30/20 2004 98 F (36.7 C)     Temp Source 10/30/20 2004 Oral     SpO2 --      Weight 10/30/20 2005 83.5 kg (184 lb)     Height 10/30/20 2005 1.575 m (5\' 2" )     Head Circumference --  Peak Flow --      Pain Score 10/30/20 2005 7     Pain Loc --      Pain Edu? --      Excl. in GC? --      Constitutional: Alert and oriented. No acute distress. Pleasant and interactive Eyes: Conjunctivae are normal.  Head: Atraumatic. Right ear: Erythematous TM with bulging Nose: No congestion/rhinnorhea. Mouth/Throat: Mucous membranes are moist.   Cardiovascular: Normal rate, regular rhythm.  Respiratory: Normal respiratory effort.  No retractions.  Musculoskeletal: No lower extremity tenderness nor edema.   Neurologic:  Normal speech and language. No gross focal neurologic deficits are appreciated.   Skin:  Skin is warm, dry and intact. No rash noted.   ____________________________________________   LABS (all labs ordered are listed, but only  abnormal results are displayed)  Labs Reviewed - No data to display ____________________________________________  EKG   ____________________________________________  RADIOLOGY  None ____________________________________________   PROCEDURES  Procedure(s) performed: No  Procedures   Critical Care performed: No ____________________________________________   INITIAL IMPRESSION / ASSESSMENT AND PLAN / ED COURSE  Pertinent labs & imaging results that were available during my care of the patient were reviewed by me and considered in my medical decision making (see chart for details).  Most consistent with otitis, will treat with amoxicillin, outpatient follow-up   ____________________________________________   FINAL CLINICAL IMPRESSION(S) / ED DIAGNOSES  Final diagnoses:  Acute otitis media, unspecified otitis media type      NEW MEDICATIONS STARTED DURING THIS VISIT:  New Prescriptions   AMOXICILLIN (AMOXIL) 500 MG CAPSULE    Take 1 capsule (500 mg total) by mouth 2 (two) times daily.     Note:  This document was prepared using Dragon voice recognition software and may include unintentional dictation errors.   Jene Every, MD 10/30/20 2037

## 2020-10-30 NOTE — ED Triage Notes (Signed)
Pt reports diagnosis of covid on the 21st, pt states now she is having right ear fullness.

## 2020-11-23 ENCOUNTER — Ambulatory Visit: Payer: Self-pay

## 2020-11-30 ENCOUNTER — Other Ambulatory Visit: Payer: Self-pay

## 2020-11-30 ENCOUNTER — Ambulatory Visit: Payer: Self-pay | Admitting: Advanced Practice Midwife

## 2020-11-30 ENCOUNTER — Encounter: Payer: Self-pay | Admitting: Advanced Practice Midwife

## 2020-11-30 DIAGNOSIS — Z113 Encounter for screening for infections with a predominantly sexual mode of transmission: Secondary | ICD-10-CM

## 2020-11-30 DIAGNOSIS — B379 Candidiasis, unspecified: Secondary | ICD-10-CM

## 2020-11-30 LAB — WET PREP FOR TRICH, YEAST, CLUE
Clue Cell Exam: POSITIVE — AB
Trichomonas Exam: NEGATIVE

## 2020-11-30 MED ORDER — CLOTRIMAZOLE 1 % VA CREA: 1.0000 | TOPICAL_CREAM | Freq: Every day | VAGINAL | 0 refills | Status: AC

## 2020-11-30 NOTE — Progress Notes (Signed)
Pt here for STD screening.  Wet mount results reviewed with Provider.  Medication dispensed per Provider verbal order.  Pt declined condoms. Berdie Ogren, RN

## 2020-11-30 NOTE — Progress Notes (Signed)
[facility] Department STI clinic/screening visit  Subjective:  Susan Shelton is a 30 y.o. SBF G1P1 (62 yo daughter) smoker female being seen today for an STI screening visit. The patient reports they do have symptoms.  Patient reports that they do desire a pregnancy in the next year.   They reported they are not interested in discussing contraception today.  Patient's last menstrual period was 11/14/2020 (exact date).   Patient has the following medical conditions:   Patient Active Problem List   Diagnosis Date Noted   Positive depression screening 12/16/2019   Smoker 11-15 cpd 09/15/2019   Marijuana use 09/15/2019   HPV  09/15/2019   Adult BMI 29.0-29.9 kg/sq m 02/24/2015   Migraine without aura and without status migrainosus, not intractable 03/15/2014    Chief Complaint  Patient presents with   SEXUALLY TRANSMITTED DISEASE    screening    HPI  Patient reports LMP 11/16/20. Last sex 11/27/20 without condom; with current partner x 2 mo; 2 partners in last 3 mo. Last MJ yesterday. Last ETOH 11/27/20 (7 shots liquor) q weekend. Smoking 10-1 ppd. Last cigar age 40.   Last HIV test per patient/review of record was 12/22/19 Patient reports last pap was 12/16/19 neg  See flowsheet for further details and programmatic requirements.    The following portions of the patient's history were reviewed and updated as appropriate: allergies, current medications, past medical history, past social history, past surgical history and problem list.  Objective:  There were no vitals filed for this visit.  Physical Exam Vitals and nursing note reviewed.  Constitutional:      Appearance: Normal appearance. She is obese.  HENT:     Head: Normocephalic and atraumatic.     Mouth/Throat:     Mouth: Mucous membranes are moist.     Pharynx: Oropharynx is clear. No oropharyngeal exudate or posterior oropharyngeal erythema.  Eyes:     Conjunctiva/sclera: Conjunctivae normal.  Pulmonary:      Effort: Pulmonary effort is normal.  Abdominal:     Palpations: Abdomen is soft. There is no mass.     Tenderness: There is no abdominal tenderness. There is no rebound.     Comments: Soft without masses or tenderness  Genitourinary:    General: Normal vulva.     Exam position: Lithotomy position.     Pubic Area: No rash or pubic lice.      Labia:        Right: No rash or lesion.        Left: No rash or lesion.      Vagina: Normal. No vaginal discharge (no visible leukorrhea, ph>4.5), erythema, bleeding or lesions.     Cervix: Normal.     Uterus: Normal.      Adnexa: Right adnexa normal and left adnexa normal.     Rectum: Normal.  Lymphadenopathy:     Head:     Right side of head: No preauricular or posterior auricular adenopathy.     Left side of head: No preauricular or posterior auricular adenopathy.     Cervical: No cervical adenopathy.     Lower Body: No right inguinal adenopathy. No left inguinal adenopathy.  Skin:    General: Skin is warm and dry.     Findings: No rash.  Neurological:     Mental Status: She is alert and oriented to person, place, and time.     Assessment and Plan:  Susan Shelton is a 30 y.o. female presenting  to the [facility] Department for STI screening  1. Screening examination for venereal disease Treat wet mount per standing orders Immunization nurse consult - WET PREP FOR TRICH, YEAST, CLUE - Syphilis Serology, [place] [facility] - HIV [place] [facility] - Chlamydia/Gonorrhea [place] [facility]     No follow-ups on file.  No future appointments.  Alberteen Spindle, CNM

## 2020-12-05 LAB — HM HIV SCREENING LAB: HM HIV Screening: NEGATIVE

## 2021-02-22 ENCOUNTER — Other Ambulatory Visit: Payer: Self-pay

## 2021-02-22 MED ORDER — PFIZER-BIONT COVID-19 VAC-TRIS 30 MCG/0.3ML IM SUSP
INTRAMUSCULAR | 0 refills | Status: DC
Start: 2021-02-22 — End: 2022-12-12
  Filled 2021-02-22: qty 0.3, 1d supply, fill #0

## 2021-02-23 ENCOUNTER — Other Ambulatory Visit: Payer: Self-pay

## 2021-02-23 ENCOUNTER — Ambulatory Visit: Payer: Self-pay | Attending: Internal Medicine

## 2021-02-23 DIAGNOSIS — Z23 Encounter for immunization: Secondary | ICD-10-CM

## 2021-02-23 NOTE — Progress Notes (Signed)
   Covid-19 Vaccination Clinic  Name:  Susan Shelton    MRN: [id number] DOB: [birth year]  02/23/2021  [name] was observed post Covid-19 immunization for 15 minutes without incident. She was provided with Vaccine Information Sheet and instruction to access the V-Safe system.   [name] was instructed to call 911 with any severe reactions post vaccine: Difficulty breathing  Swelling of face and throat  A fast heartbeat  A bad rash all over body  Dizziness and weakness   Immunizations Administered     Name Date Dose VIS Date Route   PFIZER Comrnaty(Gray TOP) Covid-19 Vaccine 02/23/2021 12:30 PM 0.3 mL 01/24/2021 Intramuscular   Manufacturer: [employer], [employer]   Lot: [id number]   NDC: [phone]

## 2021-03-06 ENCOUNTER — Ambulatory Visit: Payer: Self-pay

## 2021-03-06 ENCOUNTER — Ambulatory Visit: Payer: Self-pay | Admitting: Family Medicine

## 2021-03-06 ENCOUNTER — Encounter: Payer: Self-pay | Admitting: Family Medicine

## 2021-03-06 ENCOUNTER — Other Ambulatory Visit: Payer: Self-pay

## 2021-03-06 DIAGNOSIS — Z113 Encounter for screening for infections with a predominantly sexual mode of transmission: Secondary | ICD-10-CM

## 2021-03-06 NOTE — Progress Notes (Signed)
Pt in clinic for STI visit today.  Patient received phone call during interview about family member who is sick and passing.    Pt given option to complete visit or return to complete visit at later time.    Pt decided to leave to be with family and will come back for  STI visit.    Wendi Snipes, FNP

## 2021-03-27 ENCOUNTER — Encounter: Payer: Self-pay | Admitting: Advanced Practice Midwife

## 2021-03-27 ENCOUNTER — Other Ambulatory Visit: Payer: Self-pay

## 2021-03-27 ENCOUNTER — Ambulatory Visit: Payer: Self-pay | Admitting: Advanced Practice Midwife

## 2021-03-27 DIAGNOSIS — N760 Acute vaginitis: Secondary | ICD-10-CM

## 2021-03-27 DIAGNOSIS — Z113 Encounter for screening for infections with a predominantly sexual mode of transmission: Secondary | ICD-10-CM

## 2021-03-27 DIAGNOSIS — B9689 Other specified bacterial agents as the cause of diseases classified elsewhere: Secondary | ICD-10-CM

## 2021-03-27 LAB — WET PREP FOR TRICH, YEAST, CLUE
Trichomonas Exam: NEGATIVE
Yeast Exam: NEGATIVE

## 2021-03-27 MED ORDER — METRONIDAZOLE 500 MG PO TABS: 500.0000 mg | ORAL_TABLET | Freq: Two times a day (BID) | ORAL | 0 refills | Status: AC

## 2021-03-27 NOTE — Progress Notes (Signed)
[facility] Department STI clinic/screening visit  Subjective:  Susan Shelton is a 30 y.o. SBF smoker G1P1 female being seen today for an STI screening visit. The patient reports they do have symptoms.  Patient reports that they do not desire a pregnancy in the next year.   They reported they are not interested in discussing contraception today.  Patient's last menstrual period was 03/11/2021 (exact date).   Patient has the following medical conditions:   Patient Active Problem List   Diagnosis Date Noted   Positive depression screening 12/16/2019   Smoker 11-15 cpd 09/15/2019   Marijuana use 09/15/2019   HPV  09/15/2019   Adult BMI 29.0-29.9 kg/sq m 02/24/2015   Migraine without aura and without status migrainosus, not intractable 03/15/2014    Chief Complaint  Patient presents with   SEXUALLY TRANSMITTED DISEASE    screening    HPI  Patient reports "feels like I have BV" with malodor x few days. Sneezing and stuffy nose "I have allergies". LMP 03/11/21. Last sex 03/04/21 without condom; with current partner x 16 years; 2 sex partners in last 3 mo. Smoking 1 ppd. Last vaped 2021. Last MJ today. Last ETOH 03/25/21 (4 shots Tequila) qo week.  Last HIV test per patient/review of record was 12/05/20 Patient reports last pap was 12/16/19 neg HPV not done  See flowsheet for further details and programmatic requirements.    The following portions of the patient's history were reviewed and updated as appropriate: allergies, current medications, past medical history, past social history, past surgical history and problem list.  Objective:  There were no vitals filed for this visit.  Physical Exam Vitals and nursing note reviewed.  Constitutional:      Appearance: Normal appearance. She is normal weight.  HENT:     Head: Normocephalic and atraumatic.     Mouth/Throat:     Mouth: Mucous membranes are moist.     Pharynx: Oropharynx is clear. No oropharyngeal exudate  or posterior oropharyngeal erythema.  Eyes:     Conjunctiva/sclera: Conjunctivae normal.  Pulmonary:     Effort: Pulmonary effort is normal.  Abdominal:     Palpations: Abdomen is soft. There is no mass.     Tenderness: There is no abdominal tenderness. There is no rebound.     Comments: Soft without masses or tenderness  Genitourinary:    General: Normal vulva.     Exam position: Lithotomy position.     Pubic Area: No rash or pubic lice.      Labia:        Right: No rash or lesion.        Left: No rash or lesion.      Vagina: Vaginal discharge (thin watery grey leukorrhea, ph>4.5 with sl malodor) present. No erythema, bleeding or lesions.     Cervix: Normal.     Uterus: Normal.      Adnexa: Right adnexa normal and left adnexa normal.     Rectum: Normal.  Lymphadenopathy:     Head:     Right side of head: No preauricular or posterior auricular adenopathy.     Left side of head: No preauricular or posterior auricular adenopathy.     Cervical:     Right cervical: No superficial, deep or posterior cervical adenopathy.    Left cervical: No superficial, deep or posterior cervical adenopathy.     Upper Body:     Right upper body: No supraclavicular or axillary adenopathy.     Left  upper body: No supraclavicular or axillary adenopathy.     Lower Body: No right inguinal adenopathy. No left inguinal adenopathy.  Skin:    General: Skin is warm and dry.     Findings: No rash.  Neurological:     Mental Status: She is alert and oriented to person, place, and time.     Assessment and Plan:  Susan Shelton is a 30 y.o. female presenting to the [facility] Department for STI screening  1. Screening examination for venereal disease Treat wet mount per standing orders Immunization nurse consult - WET PREP FOR TRICH, YEAST, CLUE - Gonococcus culture - Chlamydia/Gonorrhea [place] [facility] - Gonococcus culture     No follow-ups on file.  No future  appointments.  Alberteen Spindle, CNM

## 2021-03-27 NOTE — Progress Notes (Signed)
Pt here for STD screening.  Wet prep results and medication dispensed per Provider orders.  Pt declined Condoms.  Berdie Ogren, RN

## 2021-03-31 LAB — GONOCOCCUS CULTURE

## 2021-05-08 IMAGING — US US PELVIS COMPLETE WITH TRANSVAGINAL
1 series · 13 of 25 positions shown · non-contrast
Comparison: None

CLINICAL DATA: 28-year-old female presents with 12 days of vaginal
bleeding. LMP 04/05/2019.

EXAM:
TRANSABDOMINAL AND TRANSVAGINAL ULTRASOUND OF PELVIS
TECHNIQUE: Both transabdominal and transvaginal ultrasound examinations of the
pelvis were performed. Transabdominal technique was performed for
global imaging of the pelvis including uterus, ovaries, adnexal
regions, and pelvic cul-de-sac. It was necessary to proceed with
endovaginal exam following the transabdominal exam to visualize the
endometrium and adnexa.

[Series 1: us pelvis complete with transvaginal · 13 of 58 slices shown]
[im 1/58]
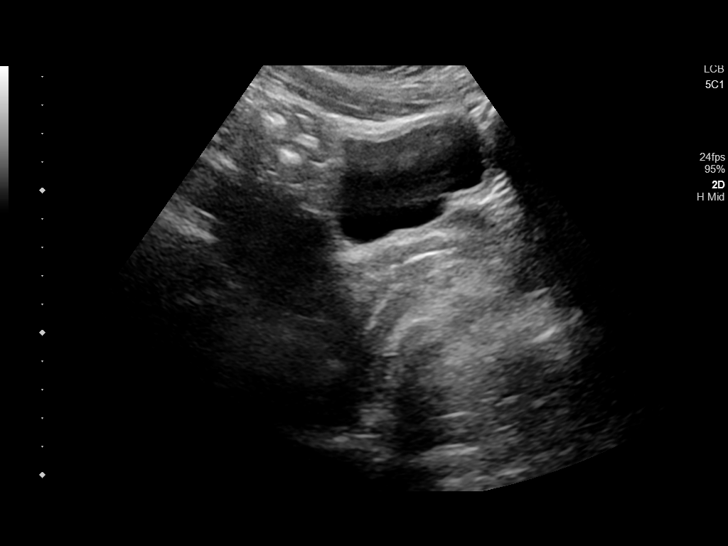
[im 5/58]
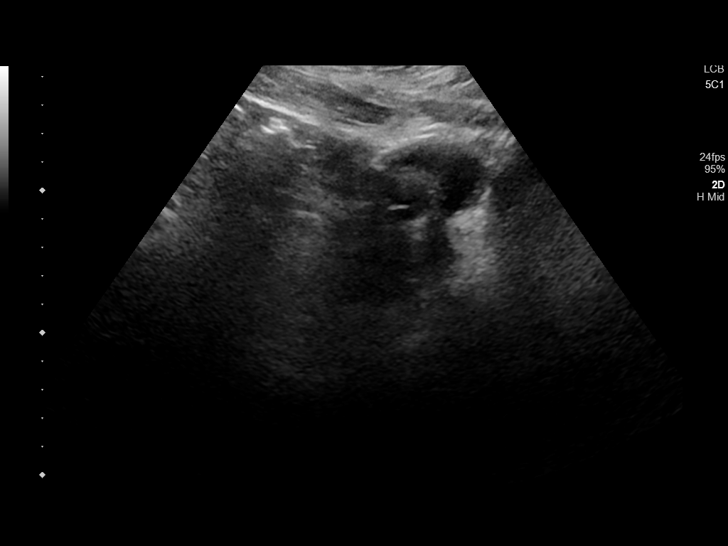
[im 10/58]
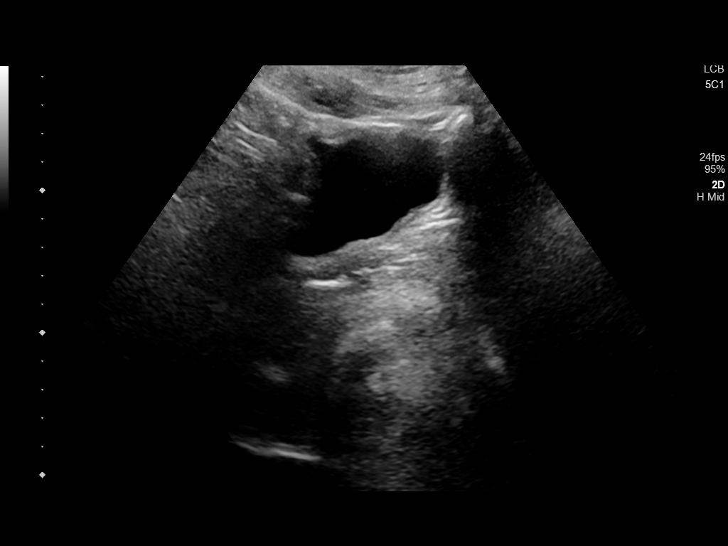
[im 15/58]
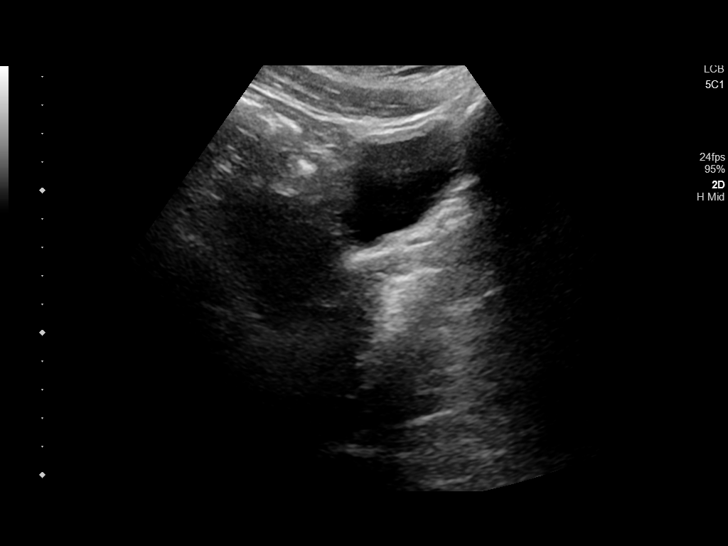
[im 20/58]
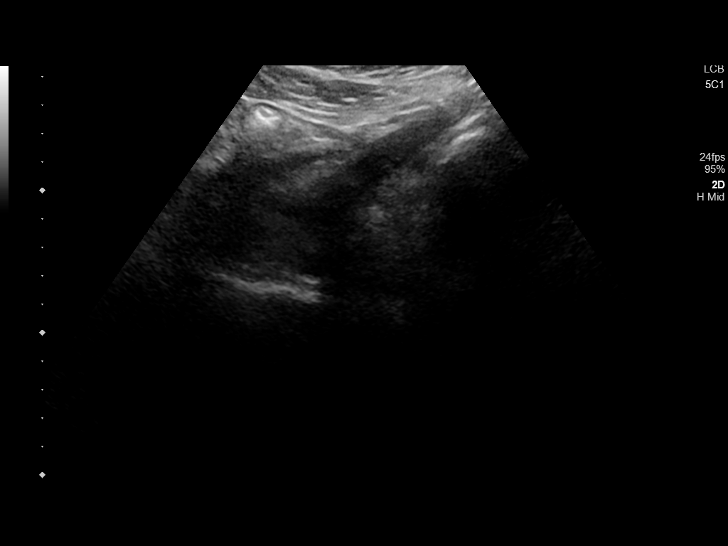
[im 24/58]
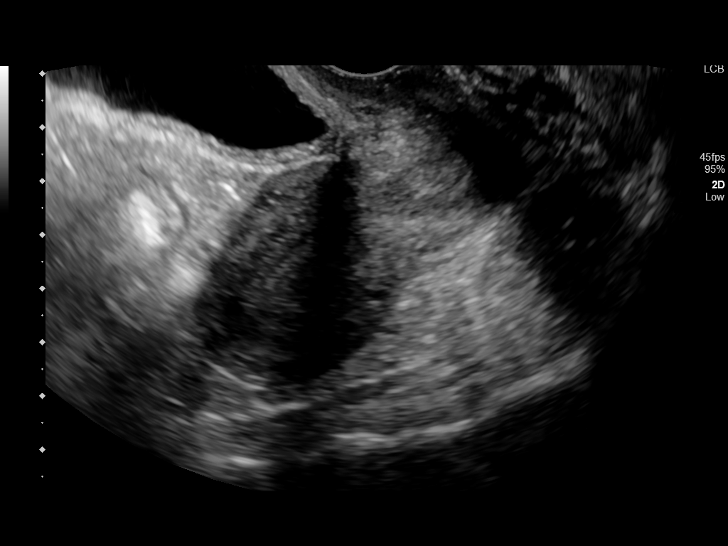
[im 29/58]
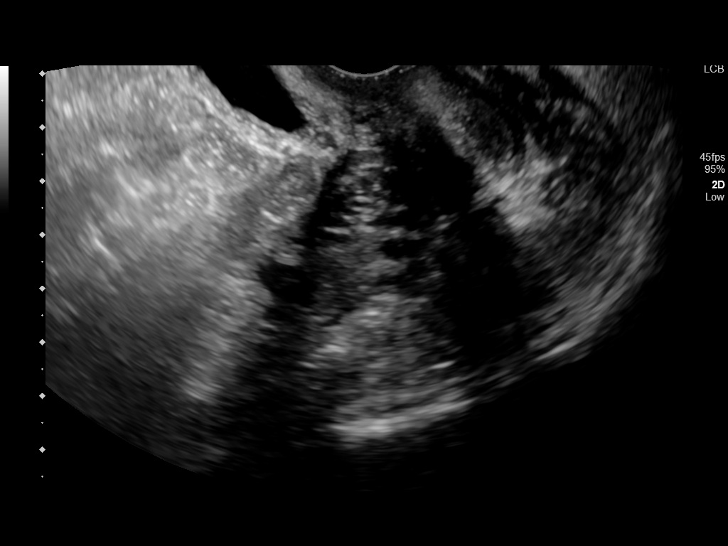
[im 34/58]
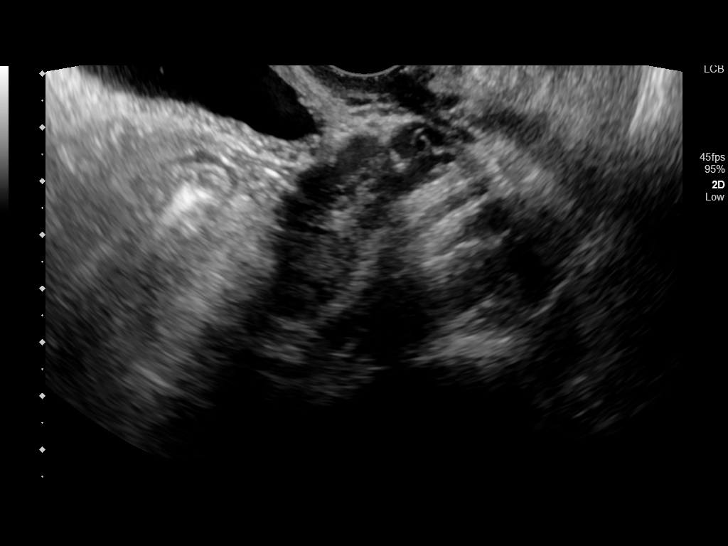
[im 39/58]
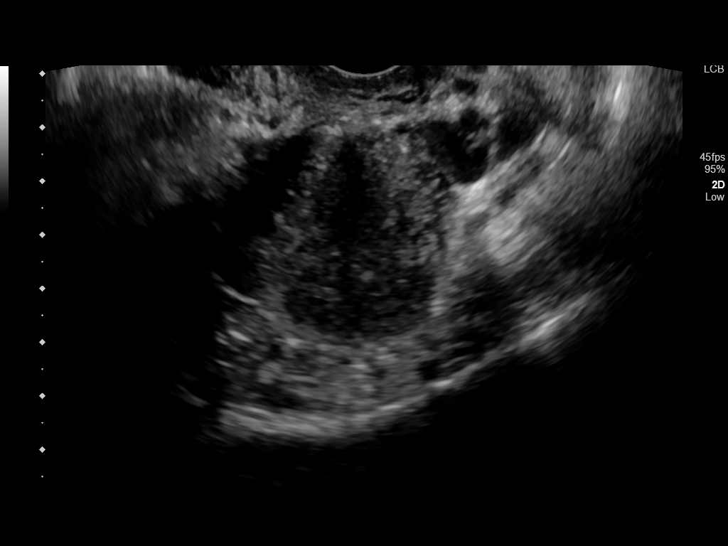
[im 43/58]
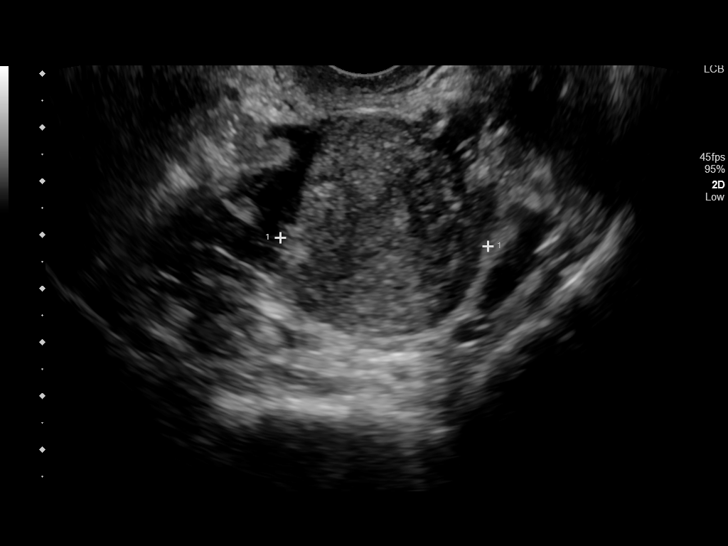
[im 48/58]
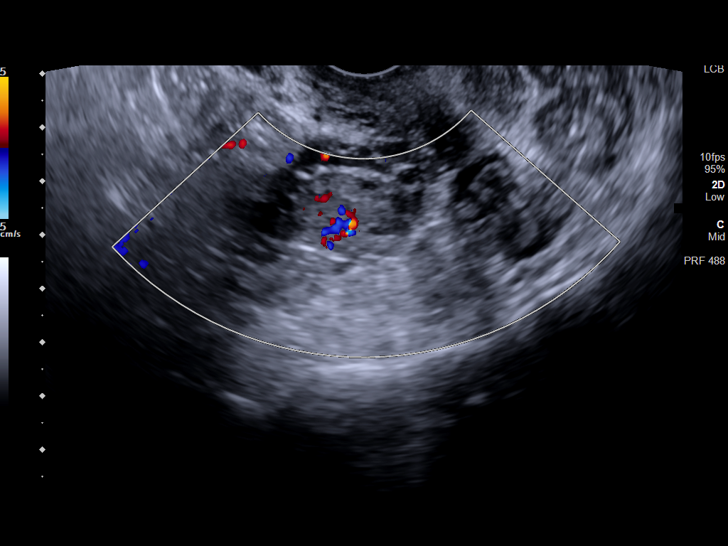
[im 53/58]
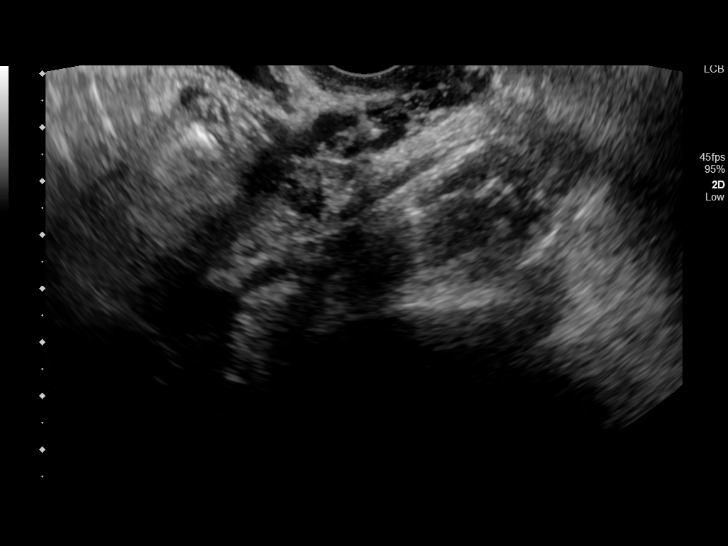
[im 58/58]
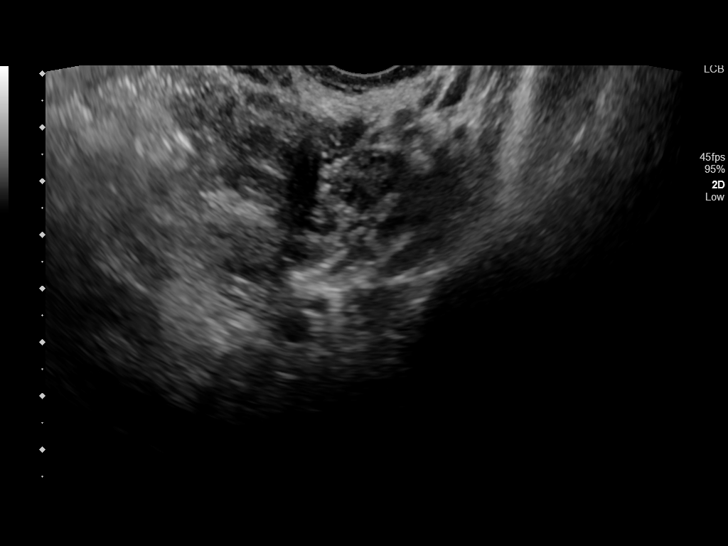

[13 of 25 positions shown; findings below may reference images not displayed]

FINDINGS: Uterus

Measurements: 6.9 x 3.4 x 3.8 cm = volume: 47 mL. Anteverted uterus
is normal in size and configuration. Suggestion of a cesarean scar
in the anterior lower uterine segment. No uterine fibroids or other
myometrial abnormalities.

Endometrium

Thickness: 4 mm. No endometrial cavity fluid or focal endometrial
mass.

Right ovary

Measurements: 3.0 x 1.8 x 2.4 cm = volume: 6.9 mL. Normal
appearance/no adnexal mass.

Left ovary

Measurements: 2.4 x 1.2 x 1.4 cm = volume: 2.2 mL. Normal
appearance/no adnexal mass.

Other findings

No abnormal free fluid.
IMPRESSION: 1. No uterine fibroids.  Normal size anteverted uterus.
2. Thin (4 mm) endometrium. No focal endometrial mass. If bleeding
remains unresponsive to hormonal or medical therapy, sonohysterogram
should be considered for focal lesion work-up. (Ref: Radiological
Reasoning: Algorithmic Workup of Abnormal Vaginal Bleeding with
Endovaginal Sonography and Sonohysterography. AJR 1994; 191:S68-73).
3. Normal ovaries.  No adnexal masses.

## 2021-05-16 ENCOUNTER — Other Ambulatory Visit: Payer: Self-pay

## 2021-05-16 ENCOUNTER — Encounter: Payer: Self-pay | Admitting: Physician Assistant

## 2021-05-16 ENCOUNTER — Emergency Department
Admission: EM | Admit: 2021-05-16 | Discharge: 2021-05-16 | Disposition: A | Discharge status: Home / Self Care | Payer: 59 | Attending: Emergency Medicine | Admitting: Emergency Medicine

## 2021-05-16 DIAGNOSIS — I10 Essential (primary) hypertension: Secondary | ICD-10-CM | POA: Diagnosis not present

## 2021-05-16 DIAGNOSIS — F17210 Nicotine dependence, cigarettes, uncomplicated: Secondary | ICD-10-CM | POA: Insufficient documentation

## 2021-05-16 DIAGNOSIS — J069 Acute upper respiratory infection, unspecified: Secondary | ICD-10-CM | POA: Diagnosis not present

## 2021-05-16 DIAGNOSIS — Z20822 Contact with and (suspected) exposure to covid-19: Secondary | ICD-10-CM | POA: Diagnosis not present

## 2021-05-16 DIAGNOSIS — B9789 Other viral agents as the cause of diseases classified elsewhere: Secondary | ICD-10-CM | POA: Diagnosis not present

## 2021-05-16 DIAGNOSIS — R059 Cough, unspecified: Secondary | ICD-10-CM | POA: Diagnosis present

## 2021-05-16 LAB — RESP PANEL BY RT-PCR (FLU A&B, COVID) ARPGX2
Influenza A by PCR: NEGATIVE
Influenza B by PCR: NEGATIVE
SARS Coronavirus 2 by RT PCR: NEGATIVE

## 2021-05-16 MED ORDER — PSEUDOEPH-BROMPHEN-DM 30-2-10 MG/5ML PO SYRP: 5.0000 mL | ORAL_SOLUTION | Freq: Four times a day (QID) | ORAL | 0 refills | Status: DC | PRN

## 2021-05-16 MED ORDER — ALBUTEROL SULFATE HFA 108 (90 BASE) MCG/ACT IN AERS: 2.0000 | INHALATION_SPRAY | Freq: Four times a day (QID) | RESPIRATORY_TRACT | 0 refills | Status: DC | PRN

## 2021-05-16 NOTE — ED Notes (Signed)
Patient discharged to home per MD order. Patient in stable condition, and deemed medically cleared by ED provider for discharge. Discharge instructions reviewed with patient/family using "Teach Back"; verbalized understanding of medication education and administration, and information about follow-up care. Denies further concerns. ° °
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
## 2021-05-16 NOTE — ED Notes (Signed)
Pt in with co runny nose, congestion and general malaise since Monday. States did a home Covid test which was negative.

## 2021-05-16 NOTE — Discharge Instructions (Signed)
Your covid and flu tests are negative today. Take the prescription meds as directed. Follow-up with employee health as needed.
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
## 2022-04-23 IMAGING — CR DG CHEST 2V
2 series · 2 of 2 positions shown · non-contrast
Comparison: Chest radiograph 11/04/2016

CLINICAL DATA: Patient presents for chest pain and cough.

EXAM:
CHEST - 2 VIEW

[chest pa]
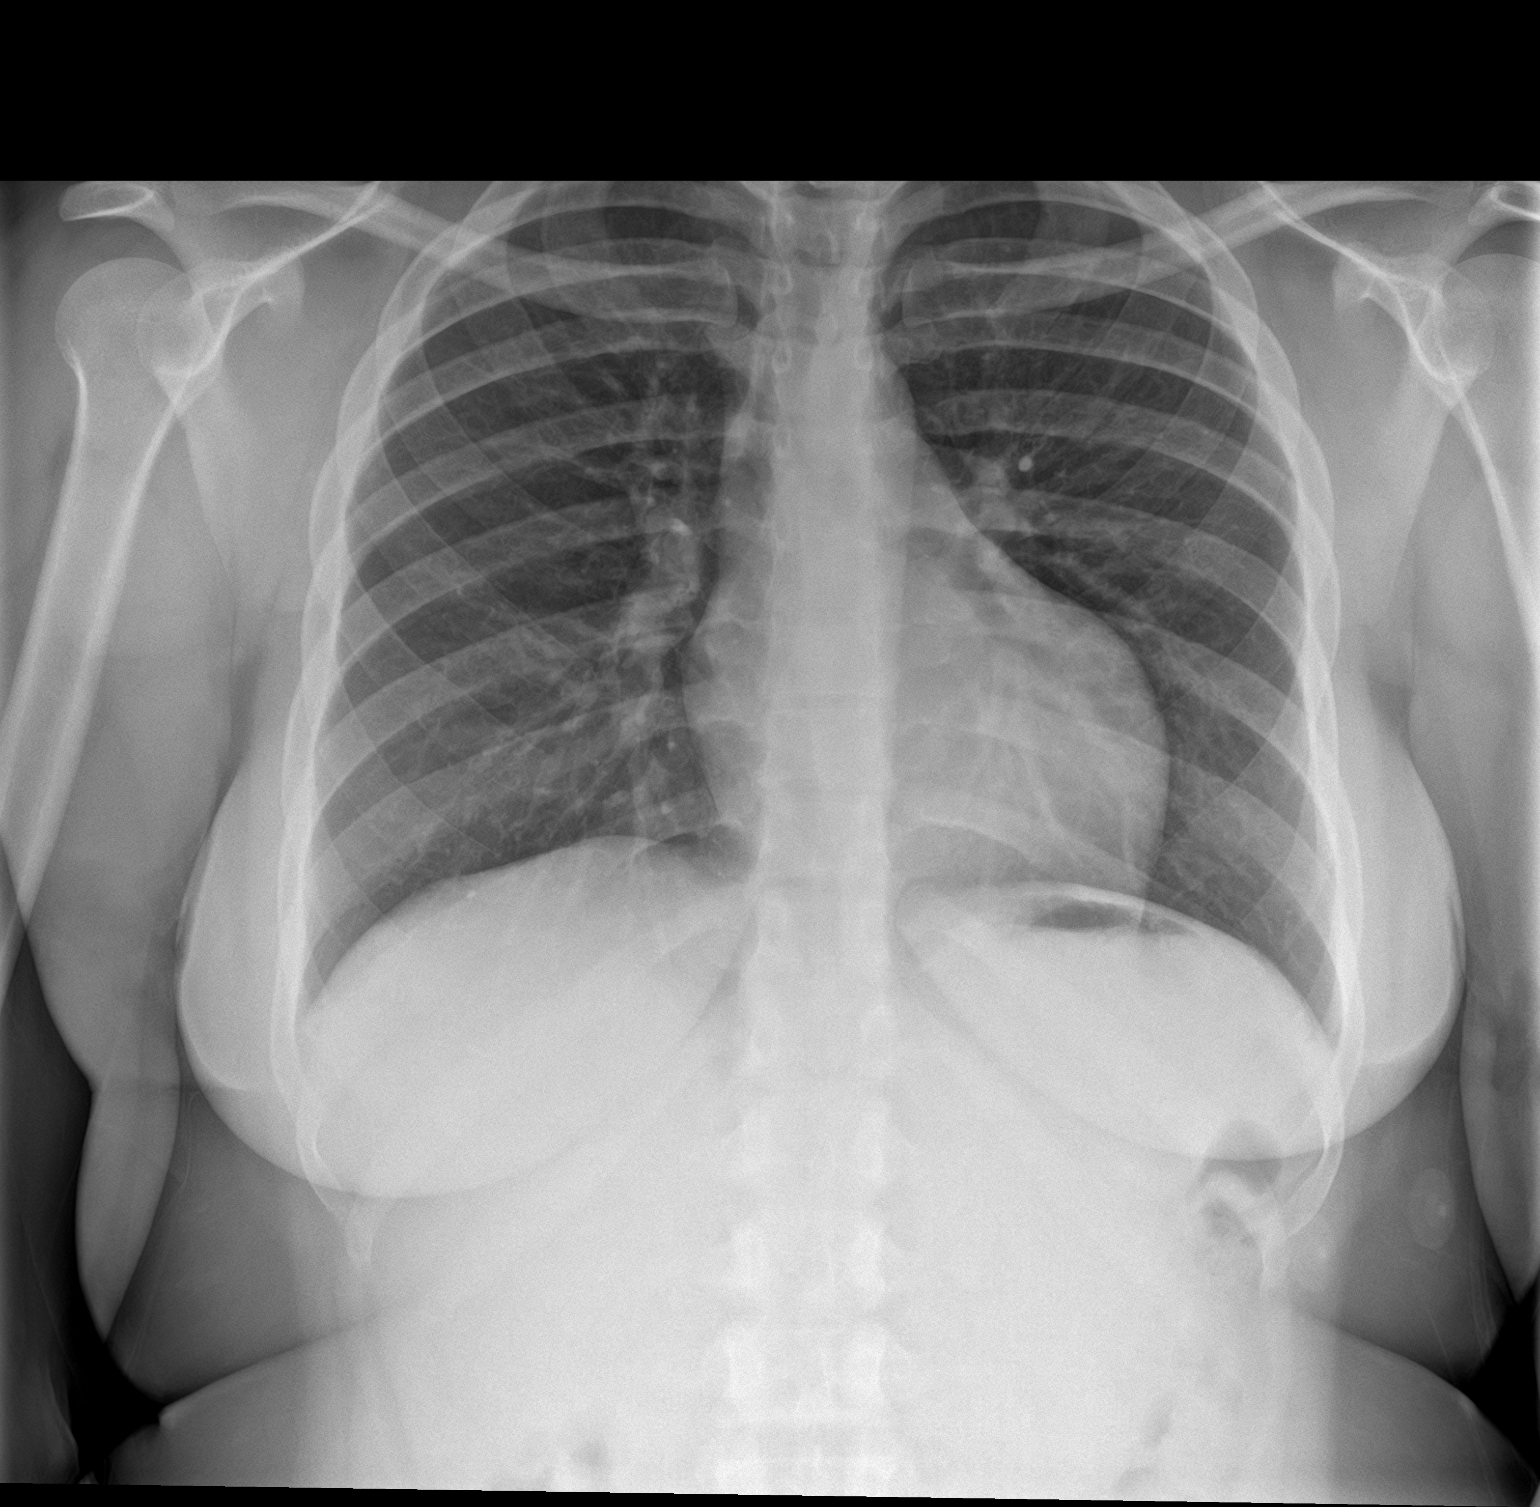

[chest lat]
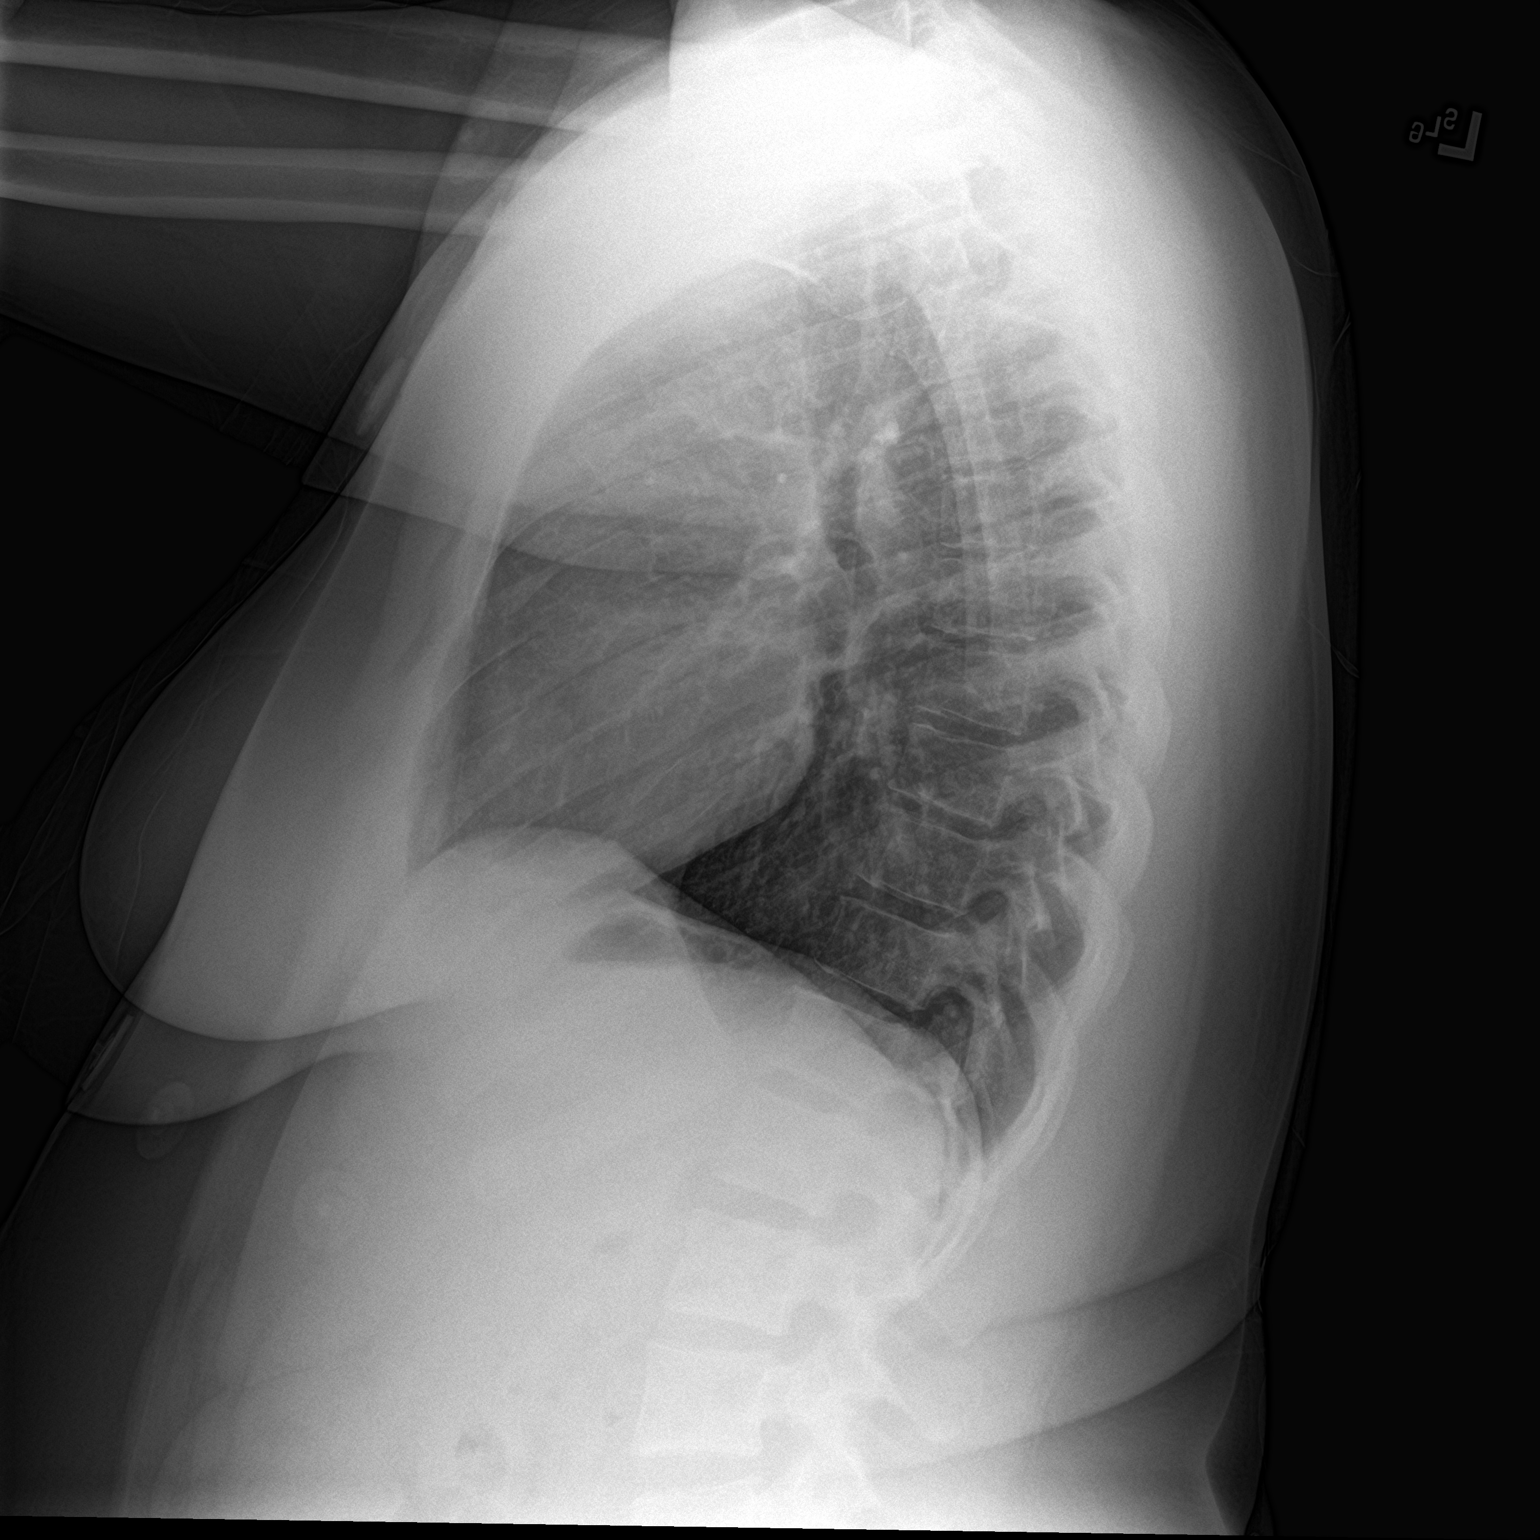

[2 of 2 positions shown; findings below may reference images not displayed]

FINDINGS: The cardiomediastinal contours are within normal limits. The lungs
are clear. No pneumothorax or pleural effusion. No acute finding in
the visualized skeleton.
IMPRESSION: No active cardiopulmonary disease.

## 2022-05-23 ENCOUNTER — Emergency Department
Admission: EM | Admit: 2022-05-23 | Discharge: 2022-05-23 | Disposition: A | Discharge status: Home / Self Care | Payer: 59 | Attending: Emergency Medicine | Admitting: Emergency Medicine

## 2022-05-23 ENCOUNTER — Emergency Department: Payer: 59

## 2022-05-23 ENCOUNTER — Other Ambulatory Visit: Payer: Self-pay

## 2022-05-23 DIAGNOSIS — R059 Cough, unspecified: Secondary | ICD-10-CM | POA: Diagnosis present

## 2022-05-23 DIAGNOSIS — I10 Essential (primary) hypertension: Secondary | ICD-10-CM | POA: Diagnosis not present

## 2022-05-23 DIAGNOSIS — Z20822 Contact with and (suspected) exposure to covid-19: Secondary | ICD-10-CM | POA: Insufficient documentation

## 2022-05-23 DIAGNOSIS — J111 Influenza due to unidentified influenza virus with other respiratory manifestations: Secondary | ICD-10-CM

## 2022-05-23 DIAGNOSIS — J101 Influenza due to other identified influenza virus with other respiratory manifestations: Secondary | ICD-10-CM | POA: Diagnosis not present

## 2022-05-23 LAB — RESP PANEL BY RT-PCR (RSV, FLU A&B, COVID)  RVPGX2
Influenza A by PCR: POSITIVE — AB
Influenza B by PCR: POSITIVE — AB
Resp Syncytial Virus by PCR: NEGATIVE
SARS Coronavirus 2 by RT PCR: NEGATIVE

## 2022-05-23 MED ORDER — PSEUDOEPHEDRINE HCL 30 MG PO TABS: 30.0000 mg | ORAL_TABLET | Freq: Four times a day (QID) | ORAL | 2 refills | Status: DC | PRN

## 2022-05-23 MED ORDER — MELOXICAM 15 MG PO TABS: 15.0000 mg | ORAL_TABLET | Freq: Every day | ORAL | 11 refills | Status: DC

## 2022-05-23 NOTE — ED Provider Notes (Signed)
[facility] Provider Note    Event Date/Time   First MD Initiated Contact with Patient 05/23/22 1432     (approximate)   History   Chief Complaint Cough, Nasal Congestion, and Generalized Body Aches   HPI Susan Shelton is a 31 y.o. female, history of hypertension, migraines, presents to the emergency department for evaluation of flulike symptoms.  She reports cough, sinus congestion, and bodyaches x 5 days.  Denies any recent known sick contacts.  Denies chest pain, shortness of breath, abdominal pain, nausea/vomiting, diarrhea, rash/lesions, or dizziness/lightheadedness.  History Limitations: No limitations.        Physical Exam  Triage Vital Signs: ED Triage Vitals  Enc Vitals Group     BP 05/23/22 1406 139/85     Pulse Rate 05/23/22 1406 (!) 102     Resp 05/23/22 1406 20     Temp 05/23/22 1406 99.6 F (37.6 C)     Temp Source 05/23/22 1406 Oral     SpO2 05/23/22 1406 97 %     Weight 05/23/22 1405 187 lb 6.3 oz (85 kg)     Height 05/23/22 1405 5\' 2"  (1.575 m)     Head Circumference --      Peak Flow --      Pain Score 05/23/22 1405 8     Pain Loc --      Pain Edu? --      Excl. in GC? --     Most recent vital signs: Vitals:   05/23/22 1406 05/23/22 1545  BP: 139/85 134/81  Pulse: (!) 102 88  Resp: 20 19  Temp: 99.6 F (37.6 C)   SpO2: 97% 98%    General: Awake, NAD.  Skin: Warm, dry. No rashes or lesions.  Eyes: PERRL. Conjunctivae normal.  CV: Good peripheral perfusion.  Resp: Normal effort.  Lung sounds clear bilaterally. Abd: Soft, non-tender. No distention.  Neuro: At baseline. No gross neurological deficits.  Musculoskeletal: Normal ROM of all extremities.  Physical Exam    ED Results / Procedures / Treatments  Labs (all labs ordered are listed, but only abnormal results are displayed) Labs Reviewed  RESP PANEL BY RT-PCR (RSV, FLU A&B, COVID)  RVPGX2 - Abnormal; Notable for the following components:       Result Value   Influenza A by PCR POSITIVE (*)    Influenza B by PCR POSITIVE (*)    All other components within normal limits     EKG N/A.    RADIOLOGY  ED Provider Interpretation: I personally viewed and interpreted this x-ray, no evidence of pneumonia.  DG Chest 2 View  Result Date: 05/23/2022 CLINICAL DATA:  Cough EXAM: CHEST - 2 VIEW COMPARISON:  04/02/2020 FINDINGS: The heart size and mediastinal contours are within normal limits. Both lungs are clear. The visualized skeletal structures are unremarkable. IMPRESSION: Normal chest radiographs. Electronically Signed   By: 13/11/2019 D.O.   On: 05/23/2022 15:03    PROCEDURES:  Critical Care performed: N/A.  Procedures    MEDICATIONS ORDERED IN ED: Medications - No data to display   IMPRESSION / MDM / ASSESSMENT AND PLAN / ED COURSE  I reviewed the triage vital signs and the nursing notes.                              Differential diagnosis includes, but is not limited to, influenza, RSV, COVID-19, community-acquired pneumonia, bronchitis, viral URI.  Assessment/Plan Presentation consistent with influenza, confirmed by PCR.  Found to have both influenza A and influenza B.  Fortunately her chest x-ray does not show any evidence of pneumonia.  Her vitals are within normal limits.  Will provide her with medications to help manage her symptoms.  Recommend that she follow with her PCP as needed.  She was amenable to this plan.  Will discharge.  Provided the patient with anticipatory guidance, return precautions, and educational material. Encouraged the patient to return to the emergency department at any time if they begin to experience any new or worsening symptoms. Patient expressed understanding and agreed with the plan.   Patient's presentation is most consistent with acute complicated illness / injury requiring diagnostic workup.       FINAL CLINICAL IMPRESSION(S) / ED DIAGNOSES   Final diagnoses:   Influenza     Rx / DC Orders   ED Discharge Orders          Ordered    meloxicam (MOBIC) 15 MG tablet  Daily,   Status:  Discontinued        05/23/22 1541    pseudoephedrine (SUDAFED) 30 MG tablet  Every 6 hours PRN,   Status:  Discontinued        05/23/22 1541    meloxicam (MOBIC) 15 MG tablet  Daily        05/23/22 1549    pseudoephedrine (SUDAFED) 30 MG tablet  Every 6 hours PRN        05/23/22 1549             Note:  This document was prepared using Dragon voice recognition software and may include unintentional dictation errors.   Varney Daily, [place] 05/23/22 [name]    Sharyn Creamer, MD 05/24/22 [phone]

## 2022-05-23 NOTE — ED Notes (Signed)
Pt would've signed for d/c paperwork but no signature pad available.

## 2022-05-23 NOTE — ED Notes (Signed)
Pt given emesis bag as requested to "spit into". Pt denies any other needs currently. Pt's resp reg/unlabored, skin dry, has coat on and sitting calmly in chair; steady upon ambulation.

## 2022-05-23 NOTE — Discharge Instructions (Addendum)
-  You tested positive for influenza.  Your symptoms will improve over the next 5 to 7 days.  In the meantime, you may take the following medications:  -You may take the meloxicam as needed for body aches/headache.  You may additionally take acetaminophen.  -You may take the pseudoephedrine as needed for sinus congestion.  -Please follow-up with your primary care provider as needed.  -Return to the emergency department anytime if you begin to experience any new or worsening symptoms.

## 2022-11-08 ENCOUNTER — Encounter: Payer: Self-pay | Admitting: Advanced Practice Midwife

## 2022-11-08 ENCOUNTER — Ambulatory Visit: Payer: Commercial Managed Care - PPO | Admitting: Advanced Practice Midwife

## 2022-11-08 DIAGNOSIS — Z113 Encounter for screening for infections with a predominantly sexual mode of transmission: Secondary | ICD-10-CM

## 2022-11-08 LAB — HM HEPATITIS C SCREENING LAB: HM Hepatitis Screen: NEGATIVE

## 2022-11-08 LAB — HM HIV SCREENING LAB: HM HIV Screening: NEGATIVE

## 2022-11-08 MED ORDER — METRONIDAZOLE 500 MG PO TABS: 500.0000 mg | ORAL_TABLET | Freq: Two times a day (BID) | ORAL | 0 refills | Status: DC

## 2022-11-08 NOTE — Progress Notes (Signed)
[facility] Department  STI clinic/screening visit [address] [place] [address] [phone]  Subjective:  Susan Shelton is a 32 y.o. SBF smoker G1P1 female being seen today for an STI screening visit. The patient reports they do have symptoms.  Patient reports that they do desire a pregnancy in the next year.   They reported they are not interested in discussing contraception today.    Patient's last menstrual period was 10/21/2022 (exact date).  Patient has the following medical conditions:   Patient Active Problem List   Diagnosis Date Noted   Positive depression screening 12/16/2019   Smoker 11-15 cpd 09/15/2019   Marijuana use 09/15/2019   HPV  09/15/2019   Adult BMI 29.0-29.9 kg/sq m 02/24/2015   Migraine without aura and without status migrainosus, not intractable 03/15/2014    No chief complaint on file.   HPI  Patient reports c/o external vaginal itching with malodor x 4 wks. LMP 10/21/22. Last sex 11/06/22 without condom; with current partner x 16 years; 1 partner in last 3 mo. Smoking 1 ppd. Last vaped yesterday. Last MJ yesterday. Last ETOH 10/05/22 (7 shots liquor). Last pap 12/16/19 neg.   Does the patient using douching products? No  Last HIV test per patient/review of record was  Lab Results  Component Value Date   HMHIVSCREEN Negative - Validated 12/05/2020    Lab Results  Component Value Date   HIV Non Reactive 09/25/2015   Patient reports last pap was No results found for: "DIAGPAP"  Lab Results  Component Value Date   SPECADGYN Comment 12/16/2019    Screening for MPX risk: Does the patient have an unexplained rash? No Is the patient MSM? No Does the patient endorse multiple sex partners or anonymous sex partners? No Did the patient have close or sexual contact with a person diagnosed with MPX? No Has the patient traveled outside the [place] where MPX is endemic? No Is there a high clinical suspicion for MPX-- evidenced by  one of the following No  -Unlikely to be chickenpox  -Lymphadenopathy  -Rash that present in same phase of evolution on any given body part See flowsheet for further details and programmatic requirements.   Immunization history:  Immunization History  Administered Date(s) Administered   PFIZER Comirnaty(Gray Top)Covid-19 Tri-Sucrose Vaccine 02/22/2021     The following portions of the patient's history were reviewed and updated as appropriate: allergies, current medications, past medical history, past social history, past surgical history and problem list.  Objective:  There were no vitals filed for this visit.  Physical Exam Vitals and nursing note reviewed.  Constitutional:      Appearance: Normal appearance. She is normal weight.  HENT:     Head: Normocephalic and atraumatic.     Mouth/Throat:     Mouth: Mucous membranes are moist.     Pharynx: Oropharynx is clear. No oropharyngeal exudate or posterior oropharyngeal erythema.  Eyes:     Conjunctiva/sclera: Conjunctivae normal.  Pulmonary:     Effort: Pulmonary effort is normal.  Abdominal:     Palpations: Abdomen is soft. There is no mass.     Tenderness: There is no abdominal tenderness. There is no rebound.     Comments: Soft without masses or tenderness  Genitourinary:    General: Normal vulva.     Exam position: Lithotomy position.     Pubic Area: No rash or pubic lice.      Labia:        Right:  No rash or lesion.        Left: No rash or lesion.      Vagina: Vaginal discharge (grey creamy leukorrhea, ph>4.5) present. No erythema, bleeding or lesions.     Cervix: Normal.     Uterus: Normal.      Adnexa: Right adnexa normal and left adnexa normal.     Rectum: Normal.     Comments: pH = >4.5 Lymphadenopathy:     Head:     Right side of head: No preauricular or posterior auricular adenopathy.     Left side of head: No preauricular or posterior auricular adenopathy.     Cervical: No cervical adenopathy.     Right  cervical: No superficial, deep or posterior cervical adenopathy.    Left cervical: No superficial, deep or posterior cervical adenopathy.     Upper Body:     Right upper body: No supraclavicular, axillary or epitrochlear adenopathy.     Left upper body: No supraclavicular, axillary or epitrochlear adenopathy.     Lower Body: No right inguinal adenopathy. No left inguinal adenopathy.  Skin:    General: Skin is warm and dry.     Findings: No rash.  Neurological:     Mental Status: She is alert and oriented to person, place, and time.      Assessment and Plan:  Susan Shelton is a 32 y.o. female presenting to the [facility] Department for STI screening  1. Screening examination for venereal disease Treat wet mount per standing orders Immunization nurse consult  - WET PREP FOR TRICH, YEAST, CLUE - Chlamydia/Gonorrhea [facility] - HIV/HCV [facility] - Syphilis Serology, [place] - Gonococcus culture   Patient accepted all screenings including oral, vaginal CT/GC and bloodwork for HIV/RPR, and wet prep. Patient meets criteria for HepB screening? Yes. Ordered? no Patient meets criteria for HepC screening? Yes. Ordered? yes  Treat wet prep per standing order Discussed time line for [facility] results and that patient will be called with positive results and encouraged patient to call if she had not heard in 2 weeks.  Counseled to return or seek care for continued or worsening symptoms Recommended repeat testing in 3 months with positive results. Recommended condom use with all sex  Patient is currently using  nothing  to prevent pregnancy.    No follow-ups on file.  No future appointments.  Alberteen Spindle, CNM

## 2022-11-08 NOTE — Progress Notes (Signed)
In House lab results reviewed during visit.  The patient was dispensed Metronidazole today. I provided counseling today regarding the medication. We discussed the medication, the side effects and when to call clinic. Patient given the opportunity to ask questions. Questions answered.BTHIELE RN

## 2022-11-11 LAB — WET PREP FOR TRICH, YEAST, CLUE
Trichomonas Exam: NEGATIVE
Yeast Exam: NEGATIVE

## 2022-11-12 LAB — GONOCOCCUS CULTURE

## 2022-12-12 ENCOUNTER — Ambulatory Visit: Payer: Commercial Managed Care - PPO | Admitting: Family Medicine

## 2022-12-12 ENCOUNTER — Encounter: Payer: Self-pay | Admitting: Family Medicine

## 2022-12-12 VITALS — BP 123/71 | HR 82 | Ht 62.0 in | Wt 189.0 lb

## 2022-12-12 DIAGNOSIS — Z113 Encounter for screening for infections with a predominantly sexual mode of transmission: Secondary | ICD-10-CM

## 2022-12-12 DIAGNOSIS — Z01419 Encounter for gynecological examination (general) (routine) without abnormal findings: Secondary | ICD-10-CM

## 2022-12-12 DIAGNOSIS — Z3009 Encounter for other general counseling and advice on contraception: Secondary | ICD-10-CM | POA: Diagnosis not present

## 2022-12-12 DIAGNOSIS — Z1331 Encounter for screening for depression: Secondary | ICD-10-CM

## 2022-12-12 LAB — WET PREP FOR TRICH, YEAST, CLUE
Trichomonas Exam: NEGATIVE
Yeast Exam: NEGATIVE

## 2022-12-12 NOTE — Progress Notes (Signed)
[facility] DEPARTMENT [facility] [address] Main Number: [phone]  Family Planning Visit- Repeat Yearly Visit  Subjective:  Susan Shelton is a 32 y.o. G1P1001  being seen today for an annual wellness visit and to discuss contraception options.   The patient is currently using Pregnant/Seeking Pregnancy for pregnancy prevention. Patient does want a pregnancy in the next year.    report they are looking for a method that provides Other pregnancy seeking   Patient has the following medical problems: has Adult BMI 29.0-29.9 kg/sq m; Migraine without aura and without status migrainosus, not intractable; Smoker 11-15 cpd; Marijuana use; HPV ; Positive depression screening; and Prediabetes on their problem list.  Chief Complaint  Patient presents with   Annual Exam    Patient reports to clinic for PE and repeat pap smear.   Patient denies concerns about self other than depression. Recently treated for BV in June   See flowsheet for other program required questions.   Body mass index is 34.57 kg/m. - Patient is eligible for diabetes screening based on BMI> 25 and age >35?  no HA1C ordered? Known pre-DM  Patient reports 1 of partners in last year. Desires STI screening?  Yes   Has patient been screened once for HCV in the past?  No  No results found for: "HCVAB"  Does the patient have current of drug use, have a partner with drug use, and/or has been incarcerated since last result? No  If yes-- Screen for HCV through [facility]   Does the patient meet criteria for HBV testing? No  Criteria:  -Household, sexual or needle sharing contact with HBV -History of drug use -HIV positive -Those with known Hep C   Health Maintenance Due  Topic Date Due   DTaP/Tdap/Td (1 - Tdap) Never done   COVID-19 Vaccine (2 - 2023-24 season) 01/25/2022   PAP SMEAR-Modifier  12/16/2022    Review of Systems  Constitutional:  Negative for weight  loss.  Eyes:  Negative for blurred vision.  Respiratory:  Negative for cough and shortness of breath.   Cardiovascular:  Negative for claudication.  Gastrointestinal:  Negative for nausea.  Genitourinary:  Negative for dysuria and frequency.  Skin:  Negative for rash.  Neurological:  Negative for headaches.  Endo/Heme/Allergies:  Does not bruise/bleed easily.  Psychiatric/Behavioral:  Positive for depression. The patient is nervous/anxious and has insomnia.     The following portions of the patient's history were reviewed and updated as appropriate: allergies, current medications, past family history, past medical history, past social history, past surgical history and problem list. Problem list updated.  Objective:   Vitals:   12/12/22 1019  BP: 123/71  Pulse: 82  Weight: 189 lb (85.7 kg)  Height: 5\' 2"  (1.575 m)    Physical Exam Vitals and nursing note reviewed.  Constitutional:      Appearance: Normal appearance.  HENT:     Head: Normocephalic and atraumatic.     Mouth/Throat:     Mouth: Mucous membranes are moist.     Pharynx: Oropharynx is clear. No oropharyngeal exudate or posterior oropharyngeal erythema.  Pulmonary:     Effort: Pulmonary effort is normal.  Chest:  Breasts:    Tanner Score is 5.     Right: Normal.     Left: Normal.  Abdominal:     General: Abdomen is flat.     Palpations: There is no mass.     Tenderness: There is no  abdominal tenderness. There is no rebound.  Genitourinary:    General: Normal vulva.     Exam position: Lithotomy position.     Pubic Area: No rash or pubic lice.      Labia:        Right: No rash or lesion.        Left: No rash or lesion.      Vagina: Normal. No vaginal discharge, erythema, bleeding or lesions.     Cervix: No cervical motion tenderness, discharge, friability, lesion or erythema.     Uterus: Normal.      Adnexa: Right adnexa normal and left adnexa normal.     Rectum: Normal.     Comments: pH =  4 Lymphadenopathy:     Head:     Right side of head: No preauricular or posterior auricular adenopathy.     Left side of head: No preauricular or posterior auricular adenopathy.     Cervical: No cervical adenopathy.     Upper Body:     Right upper body: No supraclavicular, axillary or epitrochlear adenopathy.     Left upper body: No supraclavicular, axillary or epitrochlear adenopathy.     Lower Body: No right inguinal adenopathy. No left inguinal adenopathy.  Skin:    General: Skin is warm and dry.     Findings: No rash.  Neurological:     Mental Status: She is alert and oriented to person, place, and time.  Psychiatric:        Mood and Affect: Mood is depressed. Affect is tearful.       Assessment and Plan:  Susan Shelton is a 32 y.o. female G1P1001 presenting to the [facility] Department for an yearly wellness and contraception visit  1. Well woman exam with routine gynecological exam -CBE today (normal), repeat in 3 years -repeat pap today, last NIL but HPV was not done  -accepts referral today for depression -reviewed medication for depression- encouraged finding PCP if she decides she wants meds  - IGP, Aptima HPV  2. Screening for venereal disease  - Chlamydia/Gonorrhea [facility] - WET PREP FOR TRICH, YEAST, CLUE  3. Family planning Contraception counseling: Reviewed options based on patient desire and reproductive life plan. Patient is interested in Pregnant/Seeking Pregnancy. This was provided to the patient today.   Risks, benefits, and typical effectiveness rates were reviewed.  Questions were answered.  Written information was also given to the patient to review.    The patient will follow up in  1 years for surveillance.  The patient was told to call with any further questions, or with any concerns about this method of contraception.  Emphasized use of condoms 100% of the time for STI prevention.  Educated on ECP and assessed need for ECP.  Not indicated- seeking pregnancy  -encouraged smoking cession -encouraged starting a PNV    Return in about 1 year (around 12/12/2023) for annual well-woman exam.  No future appointments.  Lenice Llamas, [place]

## 2022-12-12 NOTE — Progress Notes (Signed)
Pt is here for family planning visit.  Family planning packet reviewed and given to pt.  Wet prep results reviewed, no treatment required per standing orders. Condoms given.  Gaspar Garbe, RN

## 2022-12-17 LAB — IGP, APTIMA HPV
HPV Aptima: NEGATIVE
PAP Smear Comment: 0

## 2023-01-02 ENCOUNTER — Ambulatory Visit: Payer: Commercial Managed Care - PPO | Admitting: Licensed Clinical Social Worker

## 2023-01-02 DIAGNOSIS — F321 Major depressive disorder, single episode, moderate: Secondary | ICD-10-CM
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
## 2023-01-05 DIAGNOSIS — F321 Major depressive disorder, single episode, moderate: Secondary | ICD-10-CM | POA: Insufficient documentation

## 2023-01-09 ENCOUNTER — Ambulatory Visit: Payer: Commercial Managed Care - PPO | Admitting: Licensed Clinical Social Worker

## 2023-01-09 DIAGNOSIS — F321 Major depressive disorder, single episode, moderate: Secondary | ICD-10-CM

## 2023-01-09 NOTE — Progress Notes (Signed)
Counselor/Therapist Progress Note  Patient ID: ABIAH KOGLER, MRN: [id number],    Date: 01/09/2023  Time Spent: 45 minutes    Treatment Type: Psychotherapy  Reported Symptoms:  low motivation, depressed mood, worries   Mental Status Exam:  Appearance:   Casual and Well Groomed     Behavior:  Appropriate, Sharing, and Motivated  Motor:  Normal  Speech/Language:   Clear and Coherent and Normal Rate  Affect:  Appropriate, Congruent, and Full Range  Mood:  normal  Thought process:  normal  Thought content:    WNL  Sensory/Perceptual disturbances:    WNL  Orientation:  oriented to person, place, time/date, and situation  Attention:  Good  Concentration:  Good  Memory:  WNL  Fund of knowledge:   Good  Insight:    Fair  Judgment:   Fair  Impulse Control:  Fair   Risk Assessment: Danger to Self:  No Self-injurious Behavior: No Danger to Others: No Duty to Warn:no Physical Aggression / Violence:No  Access to Firearms a concern: No  Gang Involvement:No   Subjective: Patient was engaged and cooperative throughout the session using time effectively to discuss thoughts, feelings, goal of treatment and treatment plan.  Patient voices continued motivation for treatment and understanding of CBTs.       Interventions: Cognitive Behavioral Therapy and Client Centered   Checked in with patient about how she has been since initial session. Reviewed previous session, including assessment. Reviewed CBTs, discussed the patient's treatment goals, and continued to build rapport. Collaboratively developed a treatment plan based on these insights. Provided support through active listening, validation of feelings, and highlighted patient's strengths.    Diagnosis:   ICD-10-CM   1. Major depressive disorder, single episode, moderate (HCC)  F32.1       Plan: Patient's goal of treatment is to get motivation to do more with her child, getting out and engaging in the world and going to work  regularly.   Treatment Target: Understand the relationship between thoughts, emotions, and behaviors  Psychoeducation on CBTs model   Oriented the client to the therapeutic approach Teach the connection between thoughts, emotions, and behaviors   Treatment Target: Increase realistic balanced thinking -to learn how to replace thinking with thoughts that are more accurate or helpful Explore patient's thoughts, beliefs, automatic thoughts, assumptions  Identify and replace unhelpful thinking patterns (upsetting ideas, self-talk and mental images) Process distress and allow for emotional release  Questioning and challenging thoughts Cognitive reappraisal  Restructuring, Socratic questioning   Treatment Target: Creating a life worth living  Values clarification   Self-care - nutrition, sleep, exercise  Mindfulness skills Interpersonal Effectiveness  Emotion Regulation Distress Tolerance   Future Appointments  Date Time Provider Department Center  01/15/2023  4:10 PM Kathreen Cosier, LCSW [facility] None   ' Kathreen Cosier, LCSW

## 2023-01-15 ENCOUNTER — Ambulatory Visit: Payer: Commercial Managed Care - PPO | Admitting: Licensed Clinical Social Worker

## 2023-01-15 NOTE — Progress Notes (Unsigned)
Counselor/Therapist Progress Note  Patient ID: Susan Shelton, MRN: [id number],    Date: 01/15/2023  Time Spent: ***   Treatment Type: Psychotherapy  Reported Symptoms: {CHL AMB Reported [phone]}  Mental Status Exam:  Appearance:   {PSY:22683}     Behavior:  {PSY:21022743}  Motor:  {PSY:22302}  Speech/Language:   {PSY:22685}  Affect:  {PSY:22687}  Mood:  {PSY:31886}  Thought process:  {PSY:31888}  Thought content:    {[phone]}  Sensory/Perceptual disturbances:    {PSY:[phone]}  Orientation:  {PSY:30297}  Attention:  {PSY:22877}  Concentration:  {[phone]}  Memory:  {PSY:[phone]}  Fund of knowledge:   {[phone]}  Insight:    {[phone]}  Judgment:   {[phone]}  Impulse Control:  {[phone]}   Risk Assessment: Danger to Self:  No Self-injurious Behavior: No Danger to Others: No Duty to Warn:no Physical Aggression / Violence:No  Access to Firearms a concern: No  Gang Involvement:No   Subjective: Patient was engaged and cooperative throughout the session using time effectively to discuss    . Patient was receptive to feedback and intervention from LCSW. Patient voices continued motivation for treatment and understanding of  . Patient is likely to benefit from future treatment because they remain motivated to decrease  and   and reports benefit of regular sessions.        Interventions: Cognitive Behavioral Therapy and Client Centered  Checked in with patient regarding their week. LCSW assisted patient in processing their thoughts and emotions about what they have experienced in ... and ... with / at .... LCSW reviewed behavior modification, distress tolerance and effective communication skills with patient. Provided support through active listening, validation of feelings, and highlighted patient's strengths.   Diagnosis:   ICD-10-CM   1. Major depressive disorder, single episode, moderate (HCC)  F32.1      Plan:  Patient's goal of treatment is to get motivation to do more with her child, getting out and engaging in the world and going to work regularly.    Treatment Target: Understand the relationship between thoughts, emotions, and behaviors  Psychoeducation on CBTs model   Oriented the client to the therapeutic approach Teach the connection between thoughts, emotions, and behaviors    Treatment Target: Increase realistic balanced thinking -to learn how to replace thinking with thoughts that are more accurate or helpful Explore patient's thoughts, beliefs, automatic thoughts, assumptions  Identify and replace unhelpful thinking patterns (upsetting ideas, self-talk and mental images) Process distress and allow for emotional release  Questioning and challenging thoughts Cognitive reappraisal  Restructuring, Socratic questioning    Treatment Target: Creating a life worth living  Values clarification   Self-care - nutrition, sleep, exercise  Mindfulness skills Interpersonal Effectiveness  Emotion Regulation Distress Tolerance  Future Appointments  Date Time Provider Department Center  01/15/2023  4:10 PM Kathreen Cosier, LCSW [facility] None    Kathreen Cosier, LCSW

## 2023-01-23 ENCOUNTER — Ambulatory Visit: Payer: Commercial Managed Care - PPO | Admitting: Licensed Clinical Social Worker

## 2023-01-30 ENCOUNTER — Ambulatory Visit: Payer: Commercial Managed Care - PPO | Admitting: Licensed Clinical Social Worker

## 2023-01-30 DIAGNOSIS — F321 Major depressive disorder, single episode, moderate: Secondary | ICD-10-CM

## 2023-01-30 NOTE — Progress Notes (Signed)
Counselor/Therapist Progress Note  Patient ID: Susan Shelton, MRN: [id number],    Date: 01/30/2023  Time Spent: 52 minutes    Treatment Type: Psychotherapy  Reported Symptoms:  Mood has been good   Mental Status Exam:  Appearance:   Casual and Neat     Behavior:  Appropriate, Sharing, Care-Taking, and Motivated  Motor:  Normal  Speech/Language:   Clear and Coherent and Normal Rate  Affect:  Appropriate, Congruent, and Full Range  Mood:  normal  Thought process:  normal  Thought content:    WNL  Sensory/Perceptual disturbances:    WNL  Orientation:  oriented to person, place, time/date, situation, and day of week  Attention:  Good  Concentration:  Good  Memory:  WNL  Fund of knowledge:   Good  Insight:    Fair  Judgment:   Fair  Impulse Control:  Fair   Risk Assessment: Danger to Self:  No Self-injurious Behavior: No Danger to Others: No Duty to Warn:no Physical Aggression / Violence:No  Access to Firearms a concern: No  Gang Involvement:No   Subjective: Patient was engaged and cooperative throughout the session using time effectively to discuss thoughts and feelings. She reports that her mood has been better the last few weeks due to having a new romantic interest. Patient was receptive to feedback and intervention from LCSW.      Interventions: Cognitive Behavioral Therapy, Mindfulness Meditation, Motivational Interviewing, and client centered  LCSW met with patient to identify needs related to stressors and functioning, and assess and monitor for signs and symptoms of depression, and assess safety. Checked in with patient and processed how they have been doing since the last follow-up session. LCSW and patient discussed her desire to decrease smoking cigarettes, assisted patient in identifying reasons for cutting back and times/places/situations that she would like to cut back/stop. LCSW reviewed sleep hygiene, encouraging her to stop use of snooze, and to notice 3  things that feel good to her in the moment she wakes up.  Provided support through active listening, validation of feelings, and highlighted patient's strengths.   Diagnosis:   ICD-10-CM   1. Major depressive disorder, single episode, moderate (HCC)  F32.1      Plan: Patient's goal of treatment is to get motivation to do more with her child, getting out and engaging in the world and going to work regularly.    Treatment Target: Understand the relationship between thoughts, emotions, and behaviors  Psychoeducation on CBTs model   Oriented the client to the therapeutic approach Teach the connection between thoughts, emotions, and behaviors    Treatment Target: Increase realistic balanced thinking -to learn how to replace thinking with thoughts that are more accurate or helpful Explore patient's thoughts, beliefs, automatic thoughts, assumptions  Identify and replace unhelpful thinking patterns (upsetting ideas, self-talk and mental images) Process distress and allow for emotional release  Questioning and challenging thoughts Cognitive reappraisal  Restructuring, Socratic questioning    Treatment Target: Creating a life worth living  Values clarification   Self-care - nutrition, sleep, exercise  Mindfulness skills Interpersonal Effectiveness  Emotion Regulation Distress Tolerance  Future Appointments  Date Time Provider Department Center  02/06/2023  2:10 PM Kathreen Cosier, LCSW [facility] None     Kathreen Cosier, LCSW

## 2023-02-06 ENCOUNTER — Ambulatory Visit: Payer: Commercial Managed Care - PPO | Admitting: Licensed Clinical Social Worker

## 2023-02-06 DIAGNOSIS — F321 Major depressive disorder, single episode, moderate: Secondary | ICD-10-CM

## 2023-02-06 NOTE — Progress Notes (Signed)
Counselor/Therapist Progress Note  Patient ID: Susan Shelton, MRN: [id number],    Date: 02/06/2023  Time Spent: 40 minutes    Treatment Type: Psychotherapy  Reported Symptoms:  Intermittent mood and anxiety symptoms; Low mood, mild anxiety, anxious thoughts, irritability   Mental Status Exam:  Appearance:   Casual, Neat, and Well Groomed     Behavior:  Appropriate, Motivated, and Assertive  Motor:  Normal  Speech/Language:   Clear and Coherent and Normal Rate  Affect:  Appropriate, Congruent, and Full Range  Mood:  normal  Thought process:  normal  Thought content:    WNL  Sensory/Perceptual disturbances:    WNL  Orientation:  oriented to person, place, time/date, situation, and day of week  Attention:  Good  Concentration:  Good  Memory:  WNL  Fund of knowledge:   Good  Insight:    Fair  Judgment:   Fair  Impulse Control:  Fair   Risk Assessment: Danger to Self:  No Self-injurious Behavior: No Danger to Others: No Duty to Warn:no Physical Aggression / Violence:No  Access to Firearms a concern: No  Gang Involvement:No   Subjective: Patient was engaged and cooperative throughout the session using time effectively to discuss thoughts and feelings. Patient reports that her mood has been overall okay and better and is generally impacted by relationship challenges. She shares that she knows her mood is better because she has been more engaged with her daughter. Patient was receptive to feedback and intervention from LCSW and voices continued motivation for treatment.   Interventions: Cognitive Behavioral Therapy and Client Centered  Checked in with patient regarding their week. LCSW assisted patient in processing their thoughts and emotions regarding relationship challenges with father of her child. LCSW validated patient's feelings of frustration, and identified origins of these feelings within the relationship. LCSW provided reflective feedback, highlighted patient's  values and options for creating change, including assertive communication and boundary setting. Provided support through active listening, validation of feelings, and highlighted patient's strengths.   Diagnosis:   ICD-10-CM   1. Major depressive disorder, single episode, moderate (HCC)  F32.1      Plan: Patient's goal of treatment is to get motivation to do more with her child, getting out and engaging in the world and going to work regularly.    Treatment Target: Understand the relationship between thoughts, emotions, and behaviors  Psychoeducation on CBTs model   Oriented the client to the therapeutic approach Teach the connection between thoughts, emotions, and behaviors    Treatment Target: Increase realistic balanced thinking -to learn how to replace thinking with thoughts that are more accurate or helpful Explore patient's thoughts, beliefs, automatic thoughts, assumptions  Identify and replace unhelpful thinking patterns (upsetting ideas, self-talk and mental images) Process distress and allow for emotional release  Questioning and challenging thoughts Cognitive reappraisal  Restructuring, Socratic questioning    Treatment Target: Creating a life worth living  Values clarification   Self-care - nutrition, sleep, exercise  Mindfulness skills Interpersonal Effectiveness  Emotion Regulation Distress Tolerance  Future Appointments  Date Time Provider Department Center  02/20/2023  2:30 PM Kathreen Cosier, LCSW [facility] None    Kathreen Cosier, LCSW

## 2023-02-20 ENCOUNTER — Ambulatory Visit: Payer: Commercial Managed Care - PPO | Admitting: Licensed Clinical Social Worker

## 2023-02-25 DIAGNOSIS — R7303 Prediabetes: Secondary | ICD-10-CM

## 2023-02-25 DIAGNOSIS — E282 Polycystic ovarian syndrome: Secondary | ICD-10-CM

## 2023-02-25 HISTORY — DX: Prediabetes: R73.03

## 2023-02-25 HISTORY — DX: Polycystic ovarian syndrome: E28.2

## 2023-03-18 ENCOUNTER — Ambulatory Visit: Payer: Commercial Managed Care - PPO | Admitting: Family

## 2023-03-18 ENCOUNTER — Encounter: Payer: Self-pay | Admitting: Family

## 2023-03-18 VITALS — BP 160/85 | HR 73 | Ht 62.0 in | Wt 184.8 lb

## 2023-03-18 DIAGNOSIS — I10 Essential (primary) hypertension: Secondary | ICD-10-CM

## 2023-03-18 DIAGNOSIS — R079 Chest pain, unspecified: Secondary | ICD-10-CM

## 2023-03-18 DIAGNOSIS — R7303 Prediabetes: Secondary | ICD-10-CM

## 2023-03-18 DIAGNOSIS — E559 Vitamin D deficiency, unspecified: Secondary | ICD-10-CM

## 2023-03-18 DIAGNOSIS — R5383 Other fatigue: Secondary | ICD-10-CM

## 2023-03-18 DIAGNOSIS — R002 Palpitations: Secondary | ICD-10-CM | POA: Diagnosis not present

## 2023-03-18 DIAGNOSIS — N979 Female infertility, unspecified: Secondary | ICD-10-CM

## 2023-03-18 DIAGNOSIS — E782 Mixed hyperlipidemia: Secondary | ICD-10-CM

## 2023-03-18 DIAGNOSIS — F321 Major depressive disorder, single episode, moderate: Secondary | ICD-10-CM

## 2023-03-18 DIAGNOSIS — E538 Deficiency of other specified B group vitamins: Secondary | ICD-10-CM

## 2023-03-19 LAB — FSH/LH
FSH: 7 m[IU]/mL
LH: 9.4 m[IU]/mL

## 2023-03-19 LAB — CMP14+EGFR
ALT: 17 [IU]/L (ref 0–32)
AST: 14 [IU]/L (ref 0–40)
Albumin: 4.4 g/dL (ref 3.9–4.9)
Alkaline Phosphatase: 49 [IU]/L (ref 44–121)
BUN/Creatinine Ratio: 10 (ref 9–23)
BUN: 8 mg/dL (ref 6–20)
Bilirubin Total: <0.2 mg/dL (ref 0.0–1.2)
CO2: 23 mmol/L (ref 20–29)
Calcium: 9.6 mg/dL (ref 8.7–10.2)
Chloride: 104 mmol/L (ref 96–106)
Creatinine, Ser: 0.8 mg/dL (ref 0.57–1.00)
Globulin, Total: 2.6 g/dL (ref 1.5–4.5)
Glucose: 91 mg/dL (ref 70–99)
Potassium: 4.1 mmol/L (ref 3.5–5.2)
Sodium: 140 mmol/L (ref 134–144)
Total Protein: 7 g/dL (ref 6.0–8.5)
eGFR: 99 mL/min/{1.73_m2} (ref >59)

## 2023-03-19 LAB — CBC WITH DIFFERENTIAL/PLATELET
Basophils Absolute: 0.1 10*3/uL (ref 0.0–0.2)
Basos: 1 %
EOS (ABSOLUTE): 0.2 10*3/uL (ref 0.0–0.4)
Eos: 1 %
Hematocrit: 37.4 % (ref 34.0–46.6)
Hemoglobin: 11.9 g/dL (ref 11.1–15.9)
Immature Grans (Abs): 0 10*3/uL (ref 0.0–0.1)
Immature Granulocytes: 0 %
Lymphocytes Absolute: 2.5 10*3/uL (ref 0.7–3.1)
Lymphs: 22 %
MCH: 26.8 pg (ref 26.6–33.0)
MCHC: 31.8 g/dL (ref 31.5–35.7)
MCV: 84 fL (ref 79–97)
Monocytes Absolute: 1 10*3/uL — ABNORMAL HIGH (ref 0.1–0.9)
Monocytes: 9 %
Neutrophils Absolute: 7.5 10*3/uL — ABNORMAL HIGH (ref 1.4–7.0)
Neutrophils: 67 %
Platelets: 385 10*3/uL (ref 150–450)
RBC: 4.44 x10E6/uL (ref 3.77–5.28)
RDW: 14.1 % (ref 11.7–15.4)
WBC: 11.2 10*3/uL — ABNORMAL HIGH (ref 3.4–10.8)

## 2023-03-19 LAB — VITAMIN D 25 HYDROXY (VIT D DEFICIENCY, FRACTURES): Vit D, 25-Hydroxy: 11.4 ng/mL — ABNORMAL LOW (ref 30.0–100.0)

## 2023-03-19 LAB — HEMOGLOBIN A1C
Est. average glucose Bld gHb Est-mCnc: 128 mg/dL
Hgb A1c MFr Bld: 6.1 % — ABNORMAL HIGH (ref 4.8–5.6)

## 2023-03-19 LAB — VITAMIN B12: Vitamin B-12: 783 pg/mL (ref 232–1245)

## 2023-03-19 LAB — LIPID PANEL
Chol/HDL Ratio: 3.9 ratio (ref 0.0–4.4)
Cholesterol, Total: 193 mg/dL (ref 100–199)
HDL: 50 mg/dL (ref >39)
LDL Chol Calc (NIH): 114 mg/dL — ABNORMAL HIGH (ref 0–99)
Triglycerides: 166 mg/dL — ABNORMAL HIGH (ref 0–149)
VLDL Cholesterol Cal: 29 mg/dL (ref 5–40)

## 2023-03-19 LAB — TSH: TSH: 0.581 u[IU]/mL (ref 0.450–4.500)

## 2023-03-19 LAB — CK TOTAL AND CKMB (NOT AT ARMC)
CK-MB Index: 1.5 ng/mL (ref 0.0–5.3)
Total CK: 135 U/L (ref 32–182)

## 2023-03-19 LAB — TROPONIN T: Troponin T (Highly Sensitive): <6 ng/L (ref 0–14)

## 2023-03-27 ENCOUNTER — Ambulatory Visit: Payer: Commercial Managed Care - PPO | Admitting: Licensed Clinical Social Worker

## 2023-04-01 ENCOUNTER — Encounter: Payer: Self-pay | Admitting: Emergency Medicine

## 2023-04-01 ENCOUNTER — Emergency Department
Admission: EM | Admit: 2023-04-01 | Discharge: 2023-04-01 | Disposition: A | Discharge status: Home / Self Care | Payer: Commercial Managed Care - PPO | Attending: Emergency Medicine | Admitting: Emergency Medicine

## 2023-04-01 ENCOUNTER — Other Ambulatory Visit: Payer: Self-pay

## 2023-04-01 DIAGNOSIS — W57XXXA Bitten or stung by nonvenomous insect and other nonvenomous arthropods, initial encounter: Secondary | ICD-10-CM | POA: Diagnosis not present

## 2023-04-01 DIAGNOSIS — S50861A Insect bite (nonvenomous) of right forearm, initial encounter: Secondary | ICD-10-CM | POA: Diagnosis present

## 2023-04-01 MED ORDER — CEPHALEXIN 500 MG PO CAPS: 500.0000 mg | ORAL_CAPSULE | Freq: Two times a day (BID) | ORAL | 0 refills | Status: DC

## 2023-04-01 NOTE — ED Triage Notes (Signed)
Patient to ED via POV for spider bite on right arm. States she believes she was bit on Sunday. C/o itchiness and soreness.

## 2023-04-01 NOTE — ED Provider Notes (Signed)
   [facility] Provider Note    Event Date/Time   First MD Initiated Contact with Patient 04/01/23 1031     (approximate)   History   Insect Bite   HPI  Susan Shelton is a 32 y.o. female who presents with complaints of possible spider bite to the right arm, she reports to area on her right lateral forearm where she woke up, she think she was bitten by something.  She reports yesterday was small but today has become larger     Physical Exam   Triage Vital Signs: ED Triage Vitals [04/01/23 0957]  Encounter Vitals Group     BP 133/82     Systolic BP Percentile      Diastolic BP Percentile      Pulse Rate 75     Resp 18     Temp 98.9 F (37.2 C)     Temp Source Oral     SpO2 100 %     Weight 84.4 kg (186 lb)     Height 1.575 m (5\' 2" )     Head Circumference      Peak Flow      Pain Score 5     Pain Loc      Pain Education      Exclude from Growth Chart     Most recent vital signs: Vitals:   04/01/23 0957  BP: 133/82  Pulse: 75  Resp: 18  Temp: 98.9 F (37.2 C)  SpO2: 100%     General: Awake, no distress.  CV:  Good peripheral perfusion.  Resp:  Normal effort.  Abd:  No distention.  Other:  Relatively small area of erythema surrounding a central raised area consistent with insect bite   ED Results / Procedures / Treatments   Labs (all labs ordered are listed, but only abnormal results are displayed) Labs Reviewed - No data to display   EKG     RADIOLOGY     PROCEDURES:  Critical Care performed:   Procedures   MEDICATIONS ORDERED IN ED: Medications - No data to display   IMPRESSION / MDM / ASSESSMENT AND PLAN / ED COURSE  I reviewed the triage vital signs and the nursing notes. Patient's presentation is most consistent with acute, uncomplicated illness.  Suspect inflammatory reaction to insect bite, doubt infection, discussed with this with her.  Recommend supportive care, will provide prescription to  fill if symptoms worsen tomorrow        FINAL CLINICAL IMPRESSION(S) / ED DIAGNOSES   Final diagnoses:  Insect bite of right forearm, initial encounter     Rx / DC Orders   ED Discharge Orders          Ordered    cephALEXin (KEFLEX) 500 MG capsule  2 times daily        04/01/23 1042             Note:  This document was prepared using Dragon voice recognition software and may include unintentional dictation errors.   Jene Every, MD 04/01/23 1150

## 2023-04-03 ENCOUNTER — Ambulatory Visit (INDEPENDENT_AMBULATORY_CARE_PROVIDER_SITE_OTHER): Payer: Commercial Managed Care - PPO | Admitting: Family

## 2023-04-03 ENCOUNTER — Encounter: Payer: Self-pay | Admitting: Family

## 2023-04-03 VITALS — BP 122/78 | HR 100 | Ht 62.0 in | Wt 184.4 lb

## 2023-04-03 DIAGNOSIS — F5101 Primary insomnia: Secondary | ICD-10-CM | POA: Insufficient documentation

## 2023-04-03 DIAGNOSIS — Z0000 Encounter for general adult medical examination without abnormal findings: Secondary | ICD-10-CM | POA: Diagnosis not present

## 2023-04-03 DIAGNOSIS — D729 Disorder of white blood cells, unspecified: Secondary | ICD-10-CM | POA: Diagnosis not present

## 2023-04-03 DIAGNOSIS — K219 Gastro-esophageal reflux disease without esophagitis: Secondary | ICD-10-CM | POA: Insufficient documentation

## 2023-04-03 DIAGNOSIS — Z0130 Encounter for examination of blood pressure without abnormal findings: Secondary | ICD-10-CM

## 2023-04-03 DIAGNOSIS — R8290 Unspecified abnormal findings in urine: Secondary | ICD-10-CM | POA: Diagnosis not present

## 2023-04-03 LAB — POCT URINALYSIS DIPSTICK
Bilirubin, UA: NEGATIVE
Glucose, UA: NEGATIVE
Ketones, UA: NEGATIVE
Leukocytes, UA: NEGATIVE
Nitrite, UA: NEGATIVE
Protein, UA: NEGATIVE
Spec Grav, UA: >=1.03 — AB (ref 1.010–1.025)
Urobilinogen, UA: 0.2 U/dL
pH, UA: 5.5 (ref 5.0–8.0)

## 2023-04-03 MED ORDER — OMEPRAZOLE 20 MG PO CPDR: 20.0000 mg | DELAYED_RELEASE_CAPSULE | Freq: Every day | ORAL | 1 refills | Status: DC

## 2023-04-03 MED ORDER — METFORMIN HCL 500 MG PO TABS: 500.0000 mg | ORAL_TABLET | Freq: Every day | ORAL | 0 refills | Status: DC

## 2023-04-03 MED ORDER — VITAMIN D (ERGOCALCIFEROL) 1.25 MG (50000 UNIT) PO CAPS: 50000.0000 [IU] | ORAL_CAPSULE | ORAL | 1 refills | Status: DC

## 2023-04-03 MED ORDER — TRAZODONE HCL 50 MG PO TABS: 50.0000 mg | ORAL_TABLET | Freq: Every evening | ORAL | 2 refills | Status: DC | PRN

## 2023-04-03 NOTE — Assessment & Plan Note (Addendum)
Sending Omeprazole rx for patient.  Will ensure that symptoms are improving when she comes in for follow up.

## 2023-04-03 NOTE — Progress Notes (Signed)
Complete physical exam  Patient: Susan Shelton   DOB: [birth year]   32 y.o. Female  MRN: [id number]  Subjective:    Chief Complaint  Patient presents with   Annual Exam    CPE    Susan Shelton is a 32 y.o. female who presents today for a complete physical exam. She reports consuming a general diet.  She generally feels fairly well. She reports sleeping poorly. She does not have additional problems to discuss today.    Most recent fall risk assessment:    04/03/2023    3:28 PM  Fall Risk   Falls in the past year? 0  Number falls in past yr: 0  Injury with Fall? 0     Most recent depression screenings:    03/28/2023    9:23 AM 03/18/2023   10:22 AM  PHQ 2/9 Scores  PHQ - 2 Score 3 3  PHQ- 9 Score 12 12    Past Medical History:  Diagnosis Date   Bacterial vaginosis    Hypertension    Migraines     Past Surgical History:  Procedure Laterality Date   CESAREAN SECTION      Family History  Problem Relation Age of Onset   Congestive Heart Failure Mother    Diabetes Mother    Hypertension Mother    Hepatitis Mother    Bipolar disorder Mother    Schizophrenia Mother    Hypertension Father    Heart disease Sister    Hypertension Sister    Irritable bowel syndrome Sister    Bipolar disorder Brother    Schizophrenia Brother    Diabetes Maternal Grandmother    Stroke Maternal Grandmother    Hypertension Paternal Grandmother     Social History   Socioeconomic History   Marital status: Single    Spouse name: Not on file   Number of children: Not on file   Years of education: Not on file   Highest education level: Not on file  Occupational History   Not on file  Tobacco Use   Smoking status: Every Day    Current packs/day: 1.00    Average packs/day: 1 pack/day for 15.0 years (15.0 ttl pk-yrs)    Types: Cigarettes, Cigars, E-cigarettes    Start date: 03/17/2008   Smokeless tobacco: Never  Vaping Use   Vaping status: Never Used  Substance and  Sexual Activity   Alcohol use: Yes    Alcohol/week: 2.0 standard drinks of alcohol    Types: 2 Shots of liquor per week    Comment: social   Drug use: Yes    Frequency: 7.0 times per week    Types: Marijuana    Comment: daily   Sexual activity: Yes    Partners: Male  Other Topics Concern   Not on file  Social History Narrative   Not on file   Social Determinants of Health   Financial Resource Strain: Not on file  Food Insecurity: Not on file  Transportation Needs: Not on file  Physical Activity: Not on file  Stress: Not on file  Social Connections: Not on file  Intimate Partner Violence: Not At Risk (12/12/2022)   Humiliation, Afraid, Rape, and Kick questionnaire    Fear of Current or Ex-Partner: No    Emotionally Abused: No    Physically Abused: No    Sexually Abused: No    Outpatient Medications Prior to Visit  Medication Sig   Prenatal Vit-Fe Fumarate-FA (PRENATAL MULTIVITAMIN) TABS tablet Take 1  tablet by mouth daily at 12 noon.   cephALEXin (KEFLEX) 500 MG capsule Take 1 capsule (500 mg total) by mouth 2 (two) times daily. (Patient not taking: Reported on 04/03/2023)   [DISCONTINUED] omeprazole (PRILOSEC) 20 MG capsule Take 1 capsule (20 mg total) by mouth daily. (Patient not taking: Reported on 04/03/2023)   No facility-administered medications prior to visit.    Review of Systems  Constitutional:  Positive for malaise/fatigue.  Psychiatric/Behavioral:  The patient has insomnia (trouble staying asleep).   All other systems reviewed and are negative.       Objective:     BP 122/78   Pulse 100   Ht 5\' 2"  (1.575 m)   Wt 184 lb 6.4 oz (83.6 kg)   SpO2 98%   BMI 33.73 kg/m    Physical Exam Vitals and nursing note reviewed.  Constitutional:      Appearance: Normal appearance. She is normal weight.  HENT:     Head: Normocephalic.  Eyes:     Extraocular Movements: Extraocular movements intact.     Conjunctiva/sclera: Conjunctivae normal.     Pupils:  Pupils are equal, round, and reactive to light.  Cardiovascular:     Rate and Rhythm: Normal rate.  Pulmonary:     Effort: Pulmonary effort is normal.  Neurological:     General: No focal deficit present.     Mental Status: She is alert and oriented to person, place, and time. Mental status is at baseline.  Psychiatric:        Mood and Affect: Mood normal.        Behavior: Behavior normal.        Thought Content: Thought content normal.        Judgment: Judgment normal.      Results for orders placed or performed in visit on 04/03/23  POCT Urinalysis Dipstick (81002)  Result Value Ref Range   Color, UA dark yellow    Clarity, UA slighty cloudy    Glucose, UA Negative Negative   Bilirubin, UA neg    Ketones, UA neg    Spec Grav, UA >=1.030 (A) 1.010 - 1.025   Blood, UA trace    pH, UA 5.5 5.0 - 8.0   Protein, UA Negative Negative   Urobilinogen, UA 0.2 0.2 or 1.0 E.U./dL   Nitrite, UA neg    Leukocytes, UA Negative Negative   Appearance slightly cloudy    Odor yes     Recent Results (from the past 2160 hour(s))  Lipid panel     Status: Abnormal   Collection Time: 03/18/23 11:10 AM  Result Value Ref Range   Cholesterol, Total 193 100 - 199 mg/dL   Triglycerides 621 (H) 0 - 149 mg/dL   HDL 50 >30 mg/dL   VLDL Cholesterol Cal 29 5 - 40 mg/dL   LDL Chol Calc (NIH) 865 (H) 0 - 99 mg/dL   Chol/HDL Ratio 3.9 0.0 - 4.4 ratio    Comment:                                   T. Chol/HDL Ratio                                             Men  Women  1/2 Avg.Risk  3.4    3.3                                   Avg.Risk  5.0    4.4                                2X Avg.Risk  9.6    7.1                                3X Avg.Risk 23.4   11.0   VITAMIN D 25 Hydroxy (Vit-D Deficiency, Fractures)     Status: Abnormal   Collection Time: 03/18/23 11:10 AM  Result Value Ref Range   Vit D, 25-Hydroxy 11.4 (L) 30.0 - 100.0 ng/mL    Comment: Vitamin D deficiency  has been defined by the Institute of Medicine and an Endocrine Society practice guideline as a level of serum 25-OH vitamin D less than 20 ng/mL (1,2). The Endocrine Society went on to further define vitamin D insufficiency as a level between 21 and 29 ng/mL (2). 1. IOM (Institute of Medicine). 2010. Dietary reference    intakes for calcium and D. [place]: The    [employer]. 2. Holick MF, Binkley [place], Bischoff-Ferrari HA, et al.    Evaluation, treatment, and prevention of vitamin D    deficiency: an Endocrine Society clinical practice    guideline. JCEM. 2011 Jul; 96(7):1911-30.   CMP14+EGFR     Status: None   Collection Time: 03/18/23 11:10 AM  Result Value Ref Range   Glucose 91 70 - 99 mg/dL   BUN 8 6 - 20 mg/dL   Creatinine, Ser 0.98 0.57 - 1.00 mg/dL   eGFR 99 >11 BJ/YNW/2.95   BUN/Creatinine Ratio 10 9 - 23   Sodium 140 134 - 144 mmol/L   Potassium 4.1 3.5 - 5.2 mmol/L   Chloride 104 96 - 106 mmol/L   CO2 23 20 - 29 mmol/L   Calcium 9.6 8.7 - 10.2 mg/dL   Total Protein 7.0 6.0 - 8.5 g/dL   Albumin 4.4 3.9 - 4.9 g/dL   Globulin, Total 2.6 1.5 - 4.5 g/dL   Bilirubin Total <6.2 0.0 - 1.2 mg/dL   Alkaline Phosphatase 49 44 - 121 IU/L   AST 14 0 - 40 IU/L   ALT 17 0 - 32 IU/L  TSH     Status: None   Collection Time: 03/18/23 11:10 AM  Result Value Ref Range   TSH 0.581 0.450 - 4.500 uIU/mL  Hemoglobin A1c     Status: Abnormal   Collection Time: 03/18/23 11:10 AM  Result Value Ref Range   Hgb A1c MFr Bld 6.1 (H) 4.8 - 5.6 %    Comment:          Prediabetes: 5.7 - 6.4          Diabetes: >6.4          Glycemic control for adults with diabetes: <7.0    Est. average glucose Bld gHb Est-mCnc 128 mg/dL  Vitamin Z30     Status: None   Collection Time: 03/18/23 11:10 AM  Result Value Ref Range   Vitamin B-12 783 232 - 1,245 pg/mL  CBC with Diff     Status: Abnormal   Collection Time: 03/18/23  11:10 AM  Result Value Ref Range   WBC 11.2 (H) 3.4 - 10.8  x10E3/uL   RBC 4.44 3.77 - 5.28 x10E6/uL   Hemoglobin 11.9 11.1 - 15.9 g/dL   Hematocrit 35.0 09.3 - 46.6 %   MCV 84 79 - 97 fL   MCH 26.8 26.6 - 33.0 pg   MCHC 31.8 31.5 - 35.7 g/dL   RDW 81.8 29.9 - 37.1 %   Platelets 385 150 - 450 x10E3/uL   Neutrophils 67 Not Estab. %   Lymphs 22 Not Estab. %   Monocytes 9 Not Estab. %   Eos 1 Not Estab. %   Basos 1 Not Estab. %   Neutrophils Absolute 7.5 (H) 1.4 - 7.0 x10E3/uL   Lymphocytes Absolute 2.5 0.7 - 3.1 x10E3/uL   Monocytes Absolute 1.0 (H) 0.1 - 0.9 x10E3/uL   EOS (ABSOLUTE) 0.2 0.0 - 0.4 x10E3/uL   Basophils Absolute 0.1 0.0 - 0.2 x10E3/uL   Immature Granulocytes 0 Not Estab. %   Immature Grans (Abs) 0.0 0.0 - 0.1 x10E3/uL  FSH/LH     Status: None   Collection Time: 03/18/23 11:10 AM  Result Value Ref Range   LH 9.4 mIU/mL    Comment:                      Adult Female              Range                       Follicular phase      2.4 -  12.6                       Ovulation phase      14.0 -  95.6                       Luteal phase          1.0 -  11.4                       Postmenopausal        7.7 -  58.5    FSH 7.0 mIU/mL    Comment:                      Adult Female             Range                       Follicular phase      3.5 -  12.5                       Ovulation phase       4.7 -  21.5                       Luteal phase          1.7 -   7.7                       Postmenopausal       25.8 - 134.8   CK total and CKMB (cardiac)not at [facility]     Status: None   Collection Time: 03/18/23 11:10 AM  Result Value Ref Range   Total CK 135 32 -  182 U/L   CK-MB Index 1.5 0.0 - 5.3 ng/mL  Troponin T     Status: None   Collection Time: 03/18/23 11:10 AM  Result Value Ref Range   Troponin T (Highly Sensitive) <6 0 - 14 ng/L    Comment: In order to distinguish acute elevations of high sensitive Troponin from other clinical conditions, the Universal Definition of myocardial infarction stresses clinical assessment and the  need for serial measurements to observe a rise and/or fall above the upper limit of the reference interval.   POCT Urinalysis Dipstick (16109)     Status: Abnormal   Collection Time: 04/03/23 10:21 AM  Result Value Ref Range   Color, UA dark yellow    Clarity, UA slighty cloudy    Glucose, UA Negative Negative   Bilirubin, UA neg    Ketones, UA neg    Spec Grav, UA >=1.030 (A) 1.010 - 1.025   Blood, UA trace    pH, UA 5.5 5.0 - 8.0   Protein, UA Negative Negative   Urobilinogen, UA 0.2 0.2 or 1.0 E.U./dL   Nitrite, UA neg    Leukocytes, UA Negative Negative   Appearance slightly cloudy    Odor yes         Assessment & Plan:    Routine Health Maintenance and Physical Exam  Immunization History  Administered Date(s) Administered   PFIZER Comirnaty(Gray Top)Covid-19 Tri-Sucrose Vaccine 02/22/2021    Health Maintenance  Topic Date Due   DTaP/Tdap/Td (1 - Tdap) Never done   INFLUENZA VACCINE  Never done   COVID-19 Vaccine (2 - 2023-24 season) 01/26/2023   Cervical Cancer Screening (HPV/Pap Cotest)  12/12/2027   Hepatitis C Screening  Completed   HIV Screening  Completed   HPV VACCINES  Aged Out    Discussed health benefits of physical activity, and encouraged her to engage in regular exercise appropriate for her age and condition.  Problem List Items Addressed This Visit       Active Problems   Gastroesophageal reflux disease without esophagitis    Sending Omeprazole rx for patient.  Will ensure that symptoms are improving when she comes in for follow up.       Relevant Medications   omeprazole (PRILOSEC) 20 MG capsule   Primary insomnia    Sending meds for patient to try for her insomnia.  Will follow up in 1 month and see if this has been helpful.       Other Visit Diagnoses     Routine general medical examination at a health care facility    -  Primary   Cloudy urine       Relevant Orders   POCT Urinalysis Dipstick (81002) (Completed)   Abnormal WBC  count       checking UA in office today to make sure this isn't what's causing her to have abnormals.      Return in about 1 month (around 05/03/2023).     Miki Kins, FNP  04/03/2023   This document may have been prepared by Dragon Voice Recognition software and as such may include unintentional dictation errors.

## 2023-04-03 NOTE — Assessment & Plan Note (Signed)
Sending meds for patient to try for her insomnia.  Will follow up in 1 month and see if this has been helpful.

## 2023-04-14 ENCOUNTER — Telehealth: Payer: Self-pay | Admitting: Family

## 2023-04-14 MED ORDER — METRONIDAZOLE 500 MG PO TABS: 500.0000 mg | ORAL_TABLET | Freq: Two times a day (BID) | ORAL | 0 refills | Status: DC

## 2023-04-14 NOTE — Telephone Encounter (Signed)
Patient called in stating she is having an odor and itching and believes she has BV. States it is more moist as well. Would you call her something in please? She doesn't get paid until Thursday and cannot afford a copay until then.  [facility] [address]

## 2023-05-03 NOTE — Progress Notes (Signed)
New Patient Office Visit  Subjective    Patient ID: Susan Shelton, female    DOB: Sep 16, 1990  Age: 32 y.o. MRN: [id number]  CC:  Chief Complaint  Patient presents with   Establish Care    NPE    HPI Susan Shelton presents to establish care Previous Primary Care provider/office:   she does have additional concerns to discuss today.   She asks about her hormone levels, if they are normal.  She also says that she has been having some chest pains. They come and go, and she says that she is unsure what the trigger is.  Does not feel that they are driven by activity, eating, or any other specific issue.   No other concerns today    Outpatient Encounter Medications as of 03/18/2023  Medication Sig   [DISCONTINUED] omeprazole (PRILOSEC) 20 MG capsule Take 1 capsule (20 mg total) by mouth daily. (Patient not taking: Reported on 04/03/2023)   No facility-administered encounter medications on file as of 03/18/2023.    Past Medical History:  Diagnosis Date   Bacterial vaginosis    Hypertension    Migraines     Past Surgical History:  Procedure Laterality Date   CESAREAN SECTION      Family History  Problem Relation Age of Onset   Congestive Heart Failure Mother    Diabetes Mother    Hypertension Mother    Hepatitis Mother    Bipolar disorder Mother    Schizophrenia Mother    Hypertension Father    Heart disease Sister    Hypertension Sister    Irritable bowel syndrome Sister    Bipolar disorder Brother    Schizophrenia Brother    Diabetes Maternal Grandmother    Stroke Maternal Grandmother    Hypertension Paternal Grandmother     Social History   Socioeconomic History   Marital status: Single    Spouse name: Not on file   Number of children: Not on file   Years of education: Not on file   Highest education level: Not on file  Occupational History   Not on file  Tobacco Use   Smoking status: Every Day    Current packs/day: 1.00    Average  packs/day: 1 pack/day for 15.1 years (15.1 ttl pk-yrs)    Types: Cigarettes, Cigars, E-cigarettes    Start date: 03/17/2008   Smokeless tobacco: Never  Vaping Use   Vaping status: Never Used  Substance and Sexual Activity   Alcohol use: Yes    Alcohol/week: 2.0 standard drinks of alcohol    Types: 2 Shots of liquor per week    Comment: social   Drug use: Yes    Frequency: 7.0 times per week    Types: Marijuana    Comment: daily   Sexual activity: Yes    Partners: Male  Other Topics Concern   Not on file  Social History Narrative   Not on file   Social Determinants of Health   Financial Resource Strain: Not on file  Food Insecurity: Not on file  Transportation Needs: Not on file  Physical Activity: Not on file  Stress: Not on file  Social Connections: Not on file  Intimate Partner Violence: Not At Risk (12/12/2022)   Humiliation, Afraid, Rape, and Kick questionnaire    Fear of Current or Ex-Partner: No    Emotionally Abused: No    Physically Abused: No    Sexually Abused: No    Review of Systems  All other  systems reviewed and are negative.       Objective    BP (!) 160/85   Pulse 73   Ht 5\' 2"  (1.575 m)   Wt 184 lb 12.8 oz (83.8 kg)   SpO2 99%   BMI 33.80 kg/m   Physical Exam Vitals and nursing note reviewed.  Constitutional:      Appearance: Normal appearance. She is normal weight.  HENT:     Head: Normocephalic.  Eyes:     Extraocular Movements: Extraocular movements intact.     Conjunctiva/sclera: Conjunctivae normal.     Pupils: Pupils are equal, round, and reactive to light.  Cardiovascular:     Rate and Rhythm: Normal rate.  Pulmonary:     Effort: Pulmonary effort is normal.  Neurological:     General: No focal deficit present.     Mental Status: She is alert and oriented to person, place, and time. Mental status is at baseline.  Psychiatric:        Mood and Affect: Mood normal.        Behavior: Behavior normal.        Thought Content:  Thought content normal.        Judgment: Judgment normal.        Assessment & Plan:   Problem List Items Addressed This Visit       Active Problems   Prediabetes   Relevant Orders   CMP14+EGFR (Completed)   Hemoglobin A1c (Completed)   CBC with Diff (Completed)   Major depressive disorder, single episode, moderate (HCC)   Relevant Orders   CMP14+EGFR (Completed)   CBC with Diff (Completed)   Other Visit Diagnoses     Palpitations    -  Primary   Relevant Orders   EKG 12-Lead   CMP14+EGFR (Completed)   CBC with Diff (Completed)   Chest pain, unspecified type       Relevant Orders   CMP14+EGFR (Completed)   CBC with Diff (Completed)   CK total and CKMB (cardiac)not at [facility] (Completed)   Troponin T (Completed)   Female fertility problems       Relevant Orders   CMP14+EGFR (Completed)   CBC with Diff (Completed)   FSH/LH (Completed)   B12 deficiency due to diet       Relevant Orders   CMP14+EGFR (Completed)   Vitamin B12 (Completed)   CBC with Diff (Completed)   Essential hypertension, benign       Relevant Orders   CMP14+EGFR (Completed)   CBC with Diff (Completed)   Mixed hyperlipidemia       Relevant Orders   Lipid panel (Completed)   CMP14+EGFR (Completed)   CBC with Diff (Completed)   Vitamin D deficiency, unspecified       Relevant Orders   VITAMIN D 25 Hydroxy (Vit-D Deficiency, Fractures) (Completed)   CMP14+EGFR (Completed)   CBC with Diff (Completed)   Other fatigue       Relevant Orders   CMP14+EGFR (Completed)   TSH (Completed)   CBC with Diff (Completed)      Checking labs today.  Will contact patient with results, or discuss at follow up appointment.  Adding CK and troponin to her labs, given the chest pains.  Will call if these are abnormal as well.   Sending her refills as well.    Return in about 2 weeks (around 04/01/2023) for NP F/U.   Total time spent: 30 minutes  Miki Kins, FNP  03/18/2023  This document may  have been prepared by [employer] and as such may include unintentional dictation errors.

## 2023-05-05 ENCOUNTER — Encounter: Payer: Self-pay | Admitting: Family

## 2023-05-05 ENCOUNTER — Ambulatory Visit: Payer: Commercial Managed Care - PPO | Admitting: Family

## 2023-05-05 VITALS — BP 118/80 | HR 79 | Ht 62.0 in | Wt 187.8 lb

## 2023-05-05 DIAGNOSIS — G43009 Migraine without aura, not intractable, without status migrainosus: Secondary | ICD-10-CM

## 2023-05-05 DIAGNOSIS — R7303 Prediabetes: Secondary | ICD-10-CM | POA: Diagnosis not present

## 2023-05-05 DIAGNOSIS — Z6829 Body mass index (BMI) 29.0-29.9, adult: Secondary | ICD-10-CM

## 2023-05-05 DIAGNOSIS — Z0130 Encounter for examination of blood pressure without abnormal findings: Secondary | ICD-10-CM

## 2023-05-05 DIAGNOSIS — F5101 Primary insomnia: Secondary | ICD-10-CM

## 2023-05-05 DIAGNOSIS — F321 Major depressive disorder, single episode, moderate: Secondary | ICD-10-CM

## 2023-05-05 MED ORDER — BUPROPION HCL ER (XL) 150 MG PO TB24: 150.0000 mg | ORAL_TABLET | Freq: Every day | ORAL | 1 refills | Status: DC

## 2023-05-19 ENCOUNTER — Telehealth: Payer: Self-pay | Admitting: Family

## 2023-05-19 ENCOUNTER — Telehealth: Payer: Self-pay

## 2023-05-19 NOTE — Telephone Encounter (Signed)
Patient took pregnancy test that came back positive and wants to know if she needs to stop any of her meds. Please advise.

## 2023-05-19 NOTE — Telephone Encounter (Signed)
Pt called and stated that she took a pregnancy test yesterday and needs to know what medications she needs to stop

## 2023-05-22 ENCOUNTER — Telehealth: Payer: Self-pay

## 2023-05-22 NOTE — Telephone Encounter (Signed)
Patient just found out she is pregnant and wants to know if she should continue taking her meds. She is not going to OB until January 3. Please advise.

## 2023-05-26 ENCOUNTER — Emergency Department
Admission: EM | Admit: 2023-05-26 | Discharge: 2023-05-26 | Disposition: A | Discharge status: Home / Self Care | Payer: Commercial Managed Care - PPO | Attending: Emergency Medicine | Admitting: Emergency Medicine

## 2023-05-26 ENCOUNTER — Emergency Department: Payer: Commercial Managed Care - PPO

## 2023-05-26 ENCOUNTER — Other Ambulatory Visit: Payer: Self-pay

## 2023-05-26 DIAGNOSIS — Z3A01 Less than 8 weeks gestation of pregnancy: Secondary | ICD-10-CM | POA: Insufficient documentation

## 2023-05-26 DIAGNOSIS — O26891 Other specified pregnancy related conditions, first trimester: Secondary | ICD-10-CM | POA: Insufficient documentation

## 2023-05-26 DIAGNOSIS — R1030 Lower abdominal pain, unspecified: Secondary | ICD-10-CM | POA: Diagnosis not present

## 2023-05-26 DIAGNOSIS — R350 Frequency of micturition: Secondary | ICD-10-CM | POA: Insufficient documentation

## 2023-05-26 LAB — COMPREHENSIVE METABOLIC PANEL
ALT: 20 U/L (ref 0–44)
AST: 16 U/L (ref 15–41)
Albumin: 4.1 g/dL (ref 3.5–5.0)
Alkaline Phosphatase: 41 U/L (ref 38–126)
Anion gap: 9 (ref 5–15)
BUN: 13 mg/dL (ref 6–20)
CO2: 23 mmol/L (ref 22–32)
Calcium: 9.2 mg/dL (ref 8.9–10.3)
Chloride: 101 mmol/L (ref 98–111)
Creatinine, Ser: 0.66 mg/dL (ref 0.44–1.00)
GFR, Estimated: >60 mL/min (ref >60)
Glucose, Bld: 87 mg/dL (ref 70–99)
Potassium: 3.8 mmol/L (ref 3.5–5.1)
Sodium: 133 mmol/L — ABNORMAL LOW (ref 135–145)
Total Bilirubin: 0.3 mg/dL (ref 0.0–1.2)
Total Protein: 7.5 g/dL (ref 6.5–8.1)

## 2023-05-26 LAB — URINALYSIS, COMPLETE (UACMP) WITH MICROSCOPIC
Bacteria, UA: NONE SEEN
Bilirubin Urine: NEGATIVE
Glucose, UA: NEGATIVE mg/dL
Hgb urine dipstick: NEGATIVE
Ketones, ur: NEGATIVE mg/dL
Leukocytes,Ua: NEGATIVE
Nitrite: NEGATIVE
Protein, ur: NEGATIVE mg/dL
Specific Gravity, Urine: 1.012 (ref 1.005–1.030)
Squamous Epithelial / HPF: 0 /[HPF] (ref 0–5)
pH: 6 (ref 5.0–8.0)

## 2023-05-26 LAB — CBC WITH DIFFERENTIAL/PLATELET
Abs Immature Granulocytes: 0.07 10*3/uL (ref 0.00–0.07)
Basophils Absolute: 0.1 10*3/uL (ref 0.0–0.1)
Basophils Relative: 1 %
Eosinophils Absolute: 0.4 10*3/uL (ref 0.0–0.5)
Eosinophils Relative: 3 %
HCT: 36.2 % (ref 36.0–46.0)
Hemoglobin: 11.4 g/dL — ABNORMAL LOW (ref 12.0–15.0)
Immature Granulocytes: 1 %
Lymphocytes Relative: 31 %
Lymphs Abs: 4.6 10*3/uL — ABNORMAL HIGH (ref 0.7–4.0)
MCH: 26.1 pg (ref 26.0–34.0)
MCHC: 31.5 g/dL (ref 30.0–36.0)
MCV: 82.8 fL (ref 80.0–100.0)
Monocytes Absolute: 1.4 10*3/uL — ABNORMAL HIGH (ref 0.1–1.0)
Monocytes Relative: 9 %
Neutro Abs: 8.1 10*3/uL — ABNORMAL HIGH (ref 1.7–7.7)
Neutrophils Relative %: 55 %
Platelets: 393 10*3/uL (ref 150–400)
RBC: 4.37 MIL/uL (ref 3.87–5.11)
RDW: 14.7 % (ref 11.5–15.5)
WBC: 14.7 10*3/uL — ABNORMAL HIGH (ref 4.0–10.5)
nRBC: 0 % (ref 0.0–0.2)

## 2023-05-26 LAB — HCG, QUANTITATIVE, PREGNANCY: hCG, Beta Chain, Quant, S: 12602 m[IU]/mL — ABNORMAL HIGH (ref <5)

## 2023-05-26 LAB — POC URINE PREG, ED: Preg Test, Ur: POSITIVE — AB

## 2023-05-26 NOTE — ED Triage Notes (Signed)
Pt is estimated to be [redacted] weeks gestation. LMP 04/18/23. G2P1A0. Pt present c/o lower abdominal cramping without bleeding. Pt denies dysuria but endorses urinary frequency. Pt reports her first urination of the morning felt "tight" but not really painful.

## 2023-05-26 NOTE — ED Notes (Signed)
[place] called pt 2x with no answer.

## 2023-05-26 NOTE — ED Notes (Signed)
Pt called 2x with no answer

## 2023-05-26 NOTE — ED Notes (Signed)
No answer when called several times from lobby 

## 2023-05-29 ENCOUNTER — Ambulatory Visit: Payer: Commercial Managed Care - PPO

## 2023-05-29 VITALS — BP 134/69 | Ht 62.0 in | Wt 188.5 lb

## 2023-05-29 DIAGNOSIS — Z3009 Encounter for other general counseling and advice on contraception: Secondary | ICD-10-CM | POA: Diagnosis not present

## 2023-05-29 DIAGNOSIS — Z3201 Encounter for pregnancy test, result positive: Secondary | ICD-10-CM | POA: Diagnosis not present

## 2023-05-29 LAB — PREGNANCY, URINE: Preg Test, Ur: POSITIVE — AB

## 2023-05-29 MED ORDER — PRENATAL 27-0.8 MG PO TABS: 1.0000 | ORAL_TABLET | Freq: Every day | ORAL | Status: AC

## 2023-05-29 NOTE — Progress Notes (Signed)
 UPT positive. Unsure where plans prenatal care but considering [facility]. Positive preg packet given and reviewed.  Taking Prilosec. Consult E Purvis Sidle, CNM who explains prilosec is considered safe in preg, but since patient is in first trimester, would recommend trying alternate treatment, such as Tums, chamomile tea.  Advised to establish prenatal care ASAP.   The patient was dispensed prenatal vitamins #100 today per [facility] Dr JAYSON Helling. I provided counseling today regarding the medication. We discussed the medication, the side effects and when to call clinic. Patient given the opportunity to ask questions. Questions answered.    Sent to clerk for presumptive elig/medicaid/preg women. Siobhan Greene, RN Consulted on the plan of care for this client.  I agree with the documented note and actions taken to provide care for this client.  CHARLENA Hillier, CNM

## 2023-05-29 NOTE — Progress Notes (Signed)
 UPT positive. Unsure where plans prenatal care but considering [facility]. Positive preg packet given and reviewed.  Taking Prilosec. Consult E Sciora, CNM who explains prilosec is considered safe in preg, but since patient is in first trimester, would recommend trying alternate treatment, such as Tums, chamomile tea.  Advised to establish prenatal care ASAP.   The patient was dispensed prenatal vitamins #100 today per [facility] Dr JAYSON Helling. I provided counseling today regarding the medication. We discussed the medication, the side effects and when to call clinic. Patient given the opportunity to ask questions. Questions answered.    Sent to clerk for presumptive elig/medicaid/preg women. Hellen Shanley, RN

## 2023-06-04 ENCOUNTER — Other Ambulatory Visit: Payer: Self-pay

## 2023-06-04 ENCOUNTER — Emergency Department: Payer: Commercial Managed Care - PPO

## 2023-06-04 ENCOUNTER — Emergency Department
Admission: EM | Admit: 2023-06-04 | Discharge: 2023-06-05 | Disposition: A | Discharge status: Home / Self Care | Payer: Commercial Managed Care - PPO | Attending: Emergency Medicine | Admitting: Emergency Medicine

## 2023-06-04 DIAGNOSIS — Z3A01 Less than 8 weeks gestation of pregnancy: Secondary | ICD-10-CM | POA: Diagnosis not present

## 2023-06-04 DIAGNOSIS — O26851 Spotting complicating pregnancy, first trimester: Secondary | ICD-10-CM | POA: Diagnosis present

## 2023-06-04 DIAGNOSIS — Z3491 Encounter for supervision of normal pregnancy, unspecified, first trimester: Secondary | ICD-10-CM

## 2023-06-04 DIAGNOSIS — O23591 Infection of other part of genital tract in pregnancy, first trimester: Secondary | ICD-10-CM | POA: Diagnosis not present

## 2023-06-04 DIAGNOSIS — B9689 Other specified bacterial agents as the cause of diseases classified elsewhere: Secondary | ICD-10-CM

## 2023-06-04 LAB — URINALYSIS, ROUTINE W REFLEX MICROSCOPIC
Bilirubin Urine: NEGATIVE
Glucose, UA: NEGATIVE mg/dL
Hgb urine dipstick: NEGATIVE
Ketones, ur: NEGATIVE mg/dL
Leukocytes,Ua: NEGATIVE
Nitrite: NEGATIVE
Protein, ur: NEGATIVE mg/dL
Specific Gravity, Urine: 1.024 (ref 1.005–1.030)
pH: 7 (ref 5.0–8.0)

## 2023-06-04 LAB — CHLAMYDIA/NGC RT PCR (ARMC ONLY)
Chlamydia Tr: NOT DETECTED
N gonorrhoeae: NOT DETECTED

## 2023-06-04 LAB — WET PREP, GENITAL
Sperm: NONE SEEN
Trich, Wet Prep: NONE SEEN
WBC, Wet Prep HPF POC: <10 (ref <10)
Yeast Wet Prep HPF POC: NONE SEEN

## 2023-06-04 LAB — HCG, QUANTITATIVE, PREGNANCY: hCG, Beta Chain, Quant, S: 107369 m[IU]/mL — ABNORMAL HIGH (ref <5)

## 2023-06-04 LAB — ABO/RH: ABO/RH(D): B POS

## 2023-06-04 MED ORDER — METRONIDAZOLE 250 MG PO TABS: 250.0000 mg | ORAL_TABLET | Freq: Three times a day (TID) | ORAL | 0 refills | Status: AC

## 2023-06-04 MED ORDER — METRONIDAZOLE 500 MG PO TABS
250.0000 mg | ORAL_TABLET | Freq: Once | ORAL | Status: AC
Start: 2023-06-05 — End: 2023-06-04
  Administered 2023-06-04: 250 mg via ORAL
  Filled 2023-06-04: qty 1

## 2023-06-04 NOTE — Discharge Instructions (Signed)
 Please follow-up with obstetrician.  Take antibiotic as prescribed.  Return to the ER for any fevers, pain, worsening symptoms or any urgent changes in her health

## 2023-06-04 NOTE — ED Provider Notes (Signed)
 [place] EMERGENCY DEPARTMENT AT [facility] Provider Note   CSN: [id number] Arrival date & time: 06/04/23  1629     History  Chief Complaint  Patient presents with   Vaginal Discharge    Susan Shelton is a 33 y.o. female G2P1 presents to the ED for evaluation of of vaginal discharge, abdominal cramping.  No severe abdominal pain.  Pain is mild and has been present for 2 days.  She is [redacted] weeks pregnant.  She has not had OB follow-up.  She denies any trauma, injury, vaginal bleeding.  She denies any urinary symptoms.  No fevers.  HPI     Home Medications Prior to Admission medications   Medication Sig Start Date End Date Taking? Authorizing Provider  metroNIDAZOLE  (FLAGYL ) 250 MG tablet Take 1 tablet (250 mg total) by mouth 3 (three) times daily for 7 days. 06/04/23 06/11/23 Yes Charlene Debby BROCKS, PA-C  omeprazole  (PRILOSEC) 20 MG capsule Take 1 capsule (20 mg total) by mouth daily. 04/03/23 06/02/23  Orlean Alan HERO, FNP  Prenatal Vit-Fe Fumarate-FA (MULTIVITAMIN-PRENATAL) 27-0.8 MG TABS tablet Take 1 tablet by mouth daily at 12 noon. 05/29/23 09/06/23  Macario Dorothyann HERO, MD  Prenatal Vit-Fe Fumarate-FA (PRENATAL MULTIVITAMIN) TABS tablet Take 1 tablet by mouth daily at 12 noon. Patient not taking: Reported on 05/29/2023    [provider]  Vitamin D , Ergocalciferol , (DRISDOL ) 1.25 MG (50000 UNIT) CAPS capsule Take 1 capsule (50,000 Units total) by mouth every 7 (seven) days. Patient not taking: Reported on 05/29/2023 04/03/23   Orlean Alan HERO, FNP      Allergies    Doxycycline    Review of Systems   Review of Systems  Physical Exam Updated Vital Signs BP 122/74   Pulse 74   Temp 98.1 F (36.7 C)   Resp 16   Ht 5' 2 (1.575 m)   Wt 85.3 kg   LMP 04/18/2023 (Exact Date)   SpO2 100%   BMI 34.39 kg/m  Physical Exam Constitutional:      Appearance: She is well-developed.  HENT:     Head: Normocephalic and atraumatic.  Eyes:     Conjunctiva/sclera:  Conjunctivae normal.  Cardiovascular:     Rate and Rhythm: Normal rate.  Pulmonary:     Effort: Pulmonary effort is normal. No respiratory distress.     Breath sounds: Normal breath sounds.  Abdominal:     General: Bowel sounds are normal. There is no distension.     Tenderness: There is no abdominal tenderness. There is no right CVA tenderness, left CVA tenderness or guarding.  Musculoskeletal:        General: Normal range of motion.     Cervical back: Normal range of motion.  Skin:    General: Skin is warm.     Findings: No rash.  Neurological:     General: No focal deficit present.     Mental Status: She is alert and oriented to person, place, and time.  Psychiatric:        Behavior: Behavior normal.        Thought Content: Thought content normal.     ED Results / Procedures / Treatments   Labs (all labs ordered are listed, but only abnormal results are displayed) Labs Reviewed  WET PREP, GENITAL - Abnormal; Notable for the following components:      Result Value   Clue Cells Wet Prep HPF POC PRESENT (*)    All other components within normal limits  URINALYSIS, ROUTINE  W REFLEX MICROSCOPIC - Abnormal; Notable for the following components:   Color, Urine YELLOW (*)    APPearance CLOUDY (*)    All other components within normal limits  HCG, QUANTITATIVE, PREGNANCY - Abnormal; Notable for the following components:   hCG, Beta Chain, Quant, S [id number] (*)    All other components within normal limits  CHLAMYDIA/NGC RT PCR ([facility] ONLY)            ABO/RH    EKG None  Radiology US  OB LESS THAN 14 WEEKS WITH OB TRANSVAGINAL Result Date: 06/04/2023 CLINICAL DATA:  Pelvic cramping for 1 day. Beta HCG 107,000. LMP 04/18/2023 EXAM: OBSTETRIC <14 WK US  AND TRANSVAGINAL OB US  TECHNIQUE: Both transabdominal and transvaginal ultrasound examinations were performed for complete evaluation of the gestation as well as the maternal uterus, adnexal regions, and pelvic cul-de-sac.  Transvaginal technique was performed to assess early pregnancy. COMPARISON:  None Available. FINDINGS: Intrauterine gestational sac: Single Yolk sac:  Visualized. Embryo:  Visualized. Cardiac Activity: Visualized. Heart Rate: 124 bpm CRL:  7.6 mm   6 w   5 d                  US  EDC: 01/23/2024 Subchorionic hemorrhage:  None visualized. Maternal uterus/adnexae: Unremarkable right ovary. Corpus luteum cyst in the left ovary. No free fluid. IMPRESSION: Single living intrauterine gestation.  No acute abnormality. Electronically Signed   By: Norman Gatlin M.D.   On: 06/04/2023 23:40    Procedures Procedures    Medications Ordered in ED Medications  metroNIDAZOLE  (FLAGYL ) tablet 250 mg (has no administration in time range)    ED Course/ Medical Decision Making/ A&P Clinical Course as of 06/04/23 2355  Wed Jun 04, 2023  2232 HCG, Dorthea Denis Earleen GORMAN(!): [phone] [JW]    Clinical Course User Index [JW] Manny Recardo Seashore                                 Medical Decision Making Amount and/or Complexity of Data Reviewed Labs: ordered. Radiology: ordered.    Final Clinical Impression(s) / ED Diagnoses Final diagnoses:  BV (bacterial vaginosis)  First trimester pregnancy  33 year old female 6 weeks 5 days pregnant with abdominal cramping and vaginal discharge.  Vital signs are stable, afebrile nontachycardic.  Urinalysis negative for UTI or hematuria.  Wet prep positive for clue cells.  She is treated with metronidazole  to 50 mg every 8 hours.  Gonorrhea chlamydia test negative.  hCG levels appropriately elevated.  Ultrasound showed single intrauterine pregnancy with no acute abnormality.  Fetal heart rate 124.  Patient is given strict return precautions and understands signs symptoms return to the ER for.  Rx / DC Orders ED Discharge Orders          Ordered    metroNIDAZOLE  (FLAGYL ) 250 MG tablet  3 times daily        06/04/23 2355              Alina Gilkey C,  PA-C 06/04/23 2357    Dorothyann Drivers, MD 06/09/23 2303

## 2023-06-04 NOTE — ED Triage Notes (Signed)
 Pt sts that she is 7wks preg. LMP 03/2223, G2P1A0. Pt sts that she thinks that she has BV due to odor and pain.

## 2023-06-04 NOTE — ED Provider Triage Note (Signed)
 Emergency Medicine Provider Triage Evaluation Note  Susan Shelton , a 33 y.o. female  was evaluated in triage.  Pt complains of of cramps, patient states pregnancy of 7 weeks.  She did 2 vaginal discharge creamy color malodorous.  Patient does not have OB/GYN  Review of Systems  Positive:  Negative:   Physical Exam  Ht 5' 2 (1.575 m)   Wt 85.3 kg   LMP 04/18/2023 (Exact Date)   BMI 34.39 kg/m  Gen:   Awake, no distress   Resp:  Normal effort  MSK:   Moves extremities without difficulty  Other:    Medical Decision Making  Medically screening exam initiated at 5:13 PM.  Appropriate orders placed.  Susan Shelton was informed that the remainder of the evaluation will be completed by another provider, this initial triage assessment does not replace that evaluation, and the importance of remaining in the ED until their evaluation is complete.  Patient with approximately 7 weeks pregnancy with vaginal discharge and cramps.  Patient will have Susan Shelton Susan Lorane, PA-C 06/04/23 1714

## 2023-06-04 NOTE — ED Notes (Signed)
 No answer when called several times from lobby

## 2023-06-05 ENCOUNTER — Ambulatory Visit: Payer: Commercial Managed Care - PPO

## 2023-06-17 ENCOUNTER — Ambulatory Visit: Payer: Commercial Managed Care - PPO | Admitting: Family

## 2023-06-17 ENCOUNTER — Encounter: Payer: Self-pay | Admitting: Family

## 2023-06-17 VITALS — BP 120/74 | HR 85 | Ht 62.0 in | Wt 195.8 lb

## 2023-06-17 DIAGNOSIS — R7303 Prediabetes: Secondary | ICD-10-CM | POA: Diagnosis not present

## 2023-06-17 DIAGNOSIS — K219 Gastro-esophageal reflux disease without esophagitis: Secondary | ICD-10-CM | POA: Diagnosis not present

## 2023-06-17 DIAGNOSIS — Z0130 Encounter for examination of blood pressure without abnormal findings: Secondary | ICD-10-CM

## 2023-06-17 NOTE — Progress Notes (Signed)
 Established Patient Office Visit  Subjective:  Patient ID: Susan Shelton, female    DOB: 12-24-1990  Age: 33 y.o. MRN: [id number]  Chief Complaint  Patient presents with   Follow-up    6 week follow up    Patient is here today for her 2 months follow up.  She has been feeling well since last appointment.   She does have additional concerns to discuss today.  She is having significant reflux issues since she's stopped the PPI. Did find out since her last appointment with me that she was pregnant.  Has appointment for OB/GYN set up next month.   Labs are not due today. She needs refills.   I have reviewed her active problem list, medication list, allergies, notes from last encounter, lab results for her appointment today.      No other concerns at this time.   Past Medical History:  Diagnosis Date   Bacterial vaginosis    Hypertension    gestational   Migraines    PCOS (polycystic ovarian syndrome) 02/2023   Pre-diabetes 02/2023    Past Surgical History:  Procedure Laterality Date   CESAREAN SECTION      Social History   Socioeconomic History   Marital status: Single    Spouse name: Not on file   Number of children: Not on file   Years of education: Not on file   Highest education level: Not on file  Occupational History   Not on file  Tobacco Use   Smoking status: Every Day    Current packs/day: 1.00    Average packs/day: 1 pack/day for 15.4 years (15.4 ttl pk-yrs)    Types: Cigarettes, Cigars, E-cigarettes    Start date: 03/17/2008   Smokeless tobacco: Never  Vaping Use   Vaping status: Never Used  Substance and Sexual Activity   Alcohol use: Not Currently    Alcohol/week: 2.0 standard drinks of alcohol    Types: 2 Shots of liquor per week    Comment: social / last use 04/2023 "shots"   Drug use: Yes    Frequency: 7.0 times per week    Types: Marijuana    Comment: daily/ counseled "trying to stop"   Sexual activity: Yes    Partners: Male     Birth control/protection: None, Injection, Pill, Implant    Comment: last bcm - ocp stopped 2014  Other Topics Concern   Not on file  Social History Narrative   Not on file   Social Drivers of Health   Financial Resource Strain: Low Risk  (07/09/2023)   Received from [facility]   Overall Financial Resource Strain (CARDIA)    Difficulty of Paying Living Expenses: Not very hard  Food Insecurity: No Food Insecurity (07/09/2023)   Received from [facility]   Hunger Vital Sign    Worried About Running Out of Food in the Last Year: Never true    Ran Out of Food in the Last Year: Never true  Transportation Needs: No Transportation Needs (07/09/2023)   Received from [facility] - Transportation    In the past 12 months, has lack of transportation kept you from medical appointments or from getting medications?: No    Lack of Transportation (Non-Medical): No  Physical Activity: Not on file  Stress: Not on file  Social Connections: Not on file  Intimate Partner Violence: Not At Risk (05/29/2023)   Humiliation, Afraid, Rape, and Kick questionnaire  Fear of Current or Ex-Partner: No    Emotionally Abused: No    Physically Abused: No    Sexually Abused: No    Family History  Problem Relation Age of Onset   Congestive Heart Failure Mother    Diabetes Mother    Hypertension Mother    Hepatitis Mother    Bipolar disorder Mother    Schizophrenia Mother    Hypertension Father    Heart disease Sister    Hypertension Sister    Irritable bowel syndrome Sister    Bipolar disorder Brother    Schizophrenia Brother    Diabetes Maternal Grandmother    Stroke Maternal Grandmother    Hypertension Paternal Grandmother     Allergies  Allergen Reactions   Doxycycline Swelling    Legs    Review of Systems  Gastrointestinal:  Positive for heartburn.  All other systems reviewed and are negative.      Objective:   BP  120/74   Pulse 85   Ht 5\' 2"  (1.575 m)   Wt 195 lb 12.8 oz (88.8 kg)   LMP 04/18/2023 (Exact Date)   SpO2 100%   BMI 35.81 kg/m   Vitals:   06/17/23 0959  BP: 120/74  Pulse: 85  Height: 5\' 2"  (1.575 m)  Weight: 195 lb 12.8 oz (88.8 kg)  SpO2: 100%  BMI (Calculated): 35.8    Physical Exam Vitals and nursing note reviewed.  Constitutional:      Appearance: Normal appearance. She is normal weight.  HENT:     Head: Normocephalic.  Eyes:     Extraocular Movements: Extraocular movements intact.     Conjunctiva/sclera: Conjunctivae normal.     Pupils: Pupils are equal, round, and reactive to light.  Cardiovascular:     Rate and Rhythm: Normal rate.  Pulmonary:     Effort: Pulmonary effort is normal.  Neurological:     General: No focal deficit present.     Mental Status: She is alert and oriented to person, place, and time. Mental status is at baseline.  Psychiatric:        Mood and Affect: Mood normal.        Behavior: Behavior normal.        Thought Content: Thought content normal.        Judgment: Judgment normal.      No results found for any visits on 06/17/23.  Recent Results (from the past 2160 hours)  POC urine preg, ED (not at [facility])     Status: Abnormal   Collection Time: 05/26/23  4:04 PM  Result Value Ref Range   Preg Test, Ur POSITIVE (A) NEGATIVE    Comment:        THE SENSITIVITY OF THIS METHODOLOGY IS >24 mIU/mL   CBC with Differential     Status: Abnormal   Collection Time: 05/26/23  4:11 PM  Result Value Ref Range   WBC 14.7 (H) 4.0 - 10.5 K/uL   RBC 4.37 3.87 - 5.11 MIL/uL   Hemoglobin 11.4 (L) 12.0 - 15.0 g/dL   HCT 40.9 81.1 - 91.4 %   MCV 82.8 80.0 - 100.0 fL   MCH 26.1 26.0 - 34.0 pg   MCHC 31.5 30.0 - 36.0 g/dL   RDW 78.2 95.6 - 21.3 %   Platelets 393 150 - 400 K/uL   nRBC 0.0 0.0 - 0.2 %   Neutrophils Relative % 55 %   Neutro Abs 8.1 (H) 1.7 - 7.7 K/uL   Lymphocytes Relative  31 %   Lymphs Abs 4.6 (H) 0.7 - 4.0 K/uL   Monocytes  Relative 9 %   Monocytes Absolute 1.4 (H) 0.1 - 1.0 K/uL   Eosinophils Relative 3 %   Eosinophils Absolute 0.4 0.0 - 0.5 K/uL   Basophils Relative 1 %   Basophils Absolute 0.1 0.0 - 0.1 K/uL   Immature Granulocytes 1 %   Abs Immature Granulocytes 0.07 0.00 - 0.07 K/uL    Comment: Performed at [facility], [address] [place], [place] [address]  Comprehensive metabolic panel     Status: Abnormal   Collection Time: 05/26/23  4:11 PM  Result Value Ref Range   Sodium 133 (L) 135 - 145 mmol/L   Potassium 3.8 3.5 - 5.1 mmol/L   Chloride 101 98 - 111 mmol/L   CO2 23 22 - 32 mmol/L   Glucose, Bld 87 70 - 99 mg/dL    Comment: Glucose reference range applies only to samples taken after fasting for at least 8 hours.   BUN 13 6 - 20 mg/dL   Creatinine, Ser 6.04 0.44 - 1.00 mg/dL   Calcium 9.2 8.9 - 54.0 mg/dL   Total Protein 7.5 6.5 - 8.1 g/dL   Albumin 4.1 3.5 - 5.0 g/dL   AST 16 15 - 41 U/L   ALT 20 0 - 44 U/L   Alkaline Phosphatase 41 38 - 126 U/L   Total Bilirubin 0.3 0.0 - 1.2 mg/dL   GFR, Estimated >98 >11 mL/min    Comment: (NOTE) Calculated using the CKD-EPI Creatinine Equation (2021)    Anion gap 9 5 - 15    Comment: Performed at [facility], [address], [place], [place] [address]  Urinalysis, Complete w Microscopic -Urine, Clean Catch     Status: Abnormal   Collection Time: 05/26/23  4:11 PM  Result Value Ref Range   Color, Urine STRAW (A) YELLOW   APPearance CLEAR (A) CLEAR   Specific Gravity, Urine 1.012 1.005 - 1.030   pH 6.0 5.0 - 8.0   Glucose, UA NEGATIVE NEGATIVE mg/dL   Hgb urine dipstick NEGATIVE NEGATIVE   Bilirubin Urine NEGATIVE NEGATIVE   Ketones, ur NEGATIVE NEGATIVE mg/dL   Protein, ur NEGATIVE NEGATIVE mg/dL   Nitrite NEGATIVE NEGATIVE   Leukocytes,Ua NEGATIVE NEGATIVE   RBC / HPF 0-5 0 - 5 RBC/hpf   WBC, UA 0-5 0 - 5 WBC/hpf   Bacteria, UA NONE SEEN NONE SEEN   Squamous Epithelial / HPF 0 0 - 5 /HPF    Comment: Performed  at [facility], [address] [place], [place] [address]  hCG, quantitative, pregnancy     Status: Abnormal   Collection Time: 05/26/23  4:11 PM  Result Value Ref Range   hCG, Beta Chain, Quant, S 12,602 (H) <5 mIU/mL    Comment:          GEST. AGE      CONC.  (mIU/mL)   <=1 WEEK        5 - 50     2 WEEKS       50 - 500     3 WEEKS       100 - 10,000     4 WEEKS     1,000 - 30,000     5 WEEKS     3,500 - 115,000   6-8 WEEKS     12,000 - 270,000    12 WEEKS     15,000 - 220,000  FEMALE AND NON-PREGNANT FEMALE:     LESS THAN 5 mIU/mL Performed at [facility], [address] [place], [place] [address]   Pregnancy, urine     Status: Abnormal   Collection Time: 05/29/23 12:00 AM  Result Value Ref Range   Preg Test, Ur Positive (A) Negative  Urinalysis, Routine w reflex microscopic -Urine, Clean Catch     Status: Abnormal   Collection Time: 06/04/23  5:13 PM  Result Value Ref Range   Color, Urine YELLOW (A) YELLOW   APPearance CLOUDY (A) CLEAR   Specific Gravity, Urine 1.024 1.005 - 1.030   pH 7.0 5.0 - 8.0   Glucose, UA NEGATIVE NEGATIVE mg/dL   Hgb urine dipstick NEGATIVE NEGATIVE   Bilirubin Urine NEGATIVE NEGATIVE   Ketones, ur NEGATIVE NEGATIVE mg/dL   Protein, ur NEGATIVE NEGATIVE mg/dL   Nitrite NEGATIVE NEGATIVE   Leukocytes,Ua NEGATIVE NEGATIVE    Comment: Performed at [facility], [address] [place], [place] [address]  Wet prep, genital     Status: Abnormal   Collection Time: 06/04/23  5:18 PM   Specimen: Urine, Clean Catch  Result Value Ref Range   Yeast Wet Prep HPF POC NONE SEEN NONE SEEN   Trich, Wet Prep NONE SEEN NONE SEEN   Clue Cells Wet Prep HPF POC PRESENT (A) NONE SEEN   WBC, Wet Prep HPF POC <10 <10   Sperm NONE SEEN     Comment: Specimen diluted due to transport tube containing more than 1 ml of saline, interpret results with caution. Performed at [facility], [address] [place], [place]  [address]   Chlamydia/NGC rt PCR [facility] only)     Status: None   Collection Time: 06/04/23  5:18 PM   Specimen: Urine, Clean Catch  Result Value Ref Range   Specimen source GC/Chlam ENDOCERVICAL    Chlamydia Tr NOT DETECTED NOT DETECTED   N gonorrhoeae NOT DETECTED NOT DETECTED    Comment: (NOTE) This CT/NG assay has not been evaluated in patients with a history of  hysterectomy. Performed at [facility], [address] [place], [place] [address]   hCG, quantitative, pregnancy     Status: Abnormal   Collection Time: 06/04/23  9:01 PM  Result Value Ref Range   hCG, Beta Chain, Quant, S 107,369 (H) <5 mIU/mL    Comment:          GEST. AGE      CONC.  (mIU/mL)   <=1 WEEK        5 - 50     2 WEEKS       50 - 500     3 WEEKS       100 - 10,000     4 WEEKS     1,000 - 30,000     5 WEEKS     3,500 - 115,000   6-8 WEEKS     12,000 - 270,000    12 WEEKS     15,000 - 220,000        FEMALE AND NON-PREGNANT FEMALE:     LESS THAN 5 mIU/mL Performed at [facility], [address] [place], [place] [address]   ABO/Rh     Status: None   Collection Time: 06/04/23  9:01 PM  Result Value Ref Range   ABO/RH(D)      B POS Performed at [facility], [address] [place], [place] [address]   CBG monitoring, ED  Status: None   Collection Time: 07/25/23  2:27 PM  Result Value Ref Range   Glucose-Capillary 99 70 - 99 mg/dL    Comment: Glucose reference range applies only to samples taken after fasting for at least 8 hours.  Urinalysis, Routine w reflex microscopic -Urine, Clean Catch     Status: Abnormal   Collection Time: 07/25/23  2:54 PM  Result Value Ref Range   Color, Urine YELLOW (A) YELLOW   APPearance CLEAR (A) CLEAR   Specific Gravity, Urine 1.017 1.005 - 1.030   pH 6.0 5.0 - 8.0   Glucose, UA NEGATIVE NEGATIVE mg/dL   Hgb urine dipstick NEGATIVE NEGATIVE   Bilirubin Urine NEGATIVE NEGATIVE   Ketones, ur NEGATIVE NEGATIVE mg/dL   Protein, ur  NEGATIVE NEGATIVE mg/dL   Nitrite NEGATIVE NEGATIVE   Leukocytes,Ua NEGATIVE NEGATIVE    Comment: Performed at [facility], [address], [place], [place] [address]       Assessment & Plan:   Problem List Items Addressed This Visit       Digestive   Gastroesophageal reflux disease without esophagitis   Let patient know she can take tums if needed for her reflux.  Otherwise suggested she wait for her OB/GYN appointment for further ideas on controlling this.         Other   Prediabetes - Primary   Patient educated on foods that contain carbohydrates and the need to decrease intake.  We discussed prediabetes, and what it means and the need for strict dietary control to prevent progression to type 2 diabetes.  Advised to decrease intake of sugary drinks, including sodas, sweet tea, and some juices, and of starch and sugar heavy foods (ie., potatoes, rice, bread, pasta, desserts). She verbalizes understanding and agreement with the changes discussed today.  A1C Continues to be in prediabetic ranges.  Will reassess at follow up after next lab check.  Patient counseled on dietary choices and verbalized understanding.         Return when antepartum care is complete.   Total time spent: 20 minutes  Miki Kins, FNP  06/17/2023   This document may have been prepared by [employer] [facility] [employer] Voice Recognition software and as such may include unintentional dictation errors.

## 2023-07-09 DIAGNOSIS — O0990 Supervision of high risk pregnancy, unspecified, unspecified trimester: Secondary | ICD-10-CM | POA: Insufficient documentation

## 2023-07-09 LAB — OB RESULTS CONSOLE HIV ANTIBODY (ROUTINE TESTING): HIV: NONREACTIVE

## 2023-07-09 LAB — OB RESULTS CONSOLE RPR: RPR: NONREACTIVE

## 2023-07-09 LAB — OB RESULTS CONSOLE HEPATITIS B SURFACE ANTIGEN: Hepatitis B Surface Ag: NEGATIVE

## 2023-07-09 LAB — OB RESULTS CONSOLE VARICELLA ZOSTER ANTIBODY, IGG: Varicella: IMMUNE

## 2023-07-09 LAB — OB RESULTS CONSOLE RUBELLA ANTIBODY, IGM: Rubella: IMMUNE

## 2023-07-25 ENCOUNTER — Emergency Department
Admission: EM | Admit: 2023-07-25 | Discharge: 2023-07-25 | Disposition: A | Discharge status: Home / Self Care | Payer: Commercial Managed Care - PPO | Attending: Emergency Medicine | Admitting: Emergency Medicine

## 2023-07-25 ENCOUNTER — Other Ambulatory Visit: Payer: Self-pay

## 2023-07-25 DIAGNOSIS — I10 Essential (primary) hypertension: Secondary | ICD-10-CM | POA: Insufficient documentation

## 2023-07-25 DIAGNOSIS — R519 Headache, unspecified: Secondary | ICD-10-CM | POA: Insufficient documentation

## 2023-07-25 DIAGNOSIS — H538 Other visual disturbances: Secondary | ICD-10-CM | POA: Diagnosis not present

## 2023-07-25 LAB — URINALYSIS, ROUTINE W REFLEX MICROSCOPIC
Bilirubin Urine: NEGATIVE
Glucose, UA: NEGATIVE mg/dL
Hgb urine dipstick: NEGATIVE
Ketones, ur: NEGATIVE mg/dL
Leukocytes,Ua: NEGATIVE
Nitrite: NEGATIVE
Protein, ur: NEGATIVE mg/dL
Specific Gravity, Urine: 1.017 (ref 1.005–1.030)
pH: 6 (ref 5.0–8.0)

## 2023-07-25 LAB — CBG MONITORING, ED: Glucose-Capillary: 99 mg/dL (ref 70–99)

## 2023-07-25 MED ORDER — ACETAMINOPHEN 500 MG PO TABS
500.0000 mg | ORAL_TABLET | Freq: Once | ORAL | Status: AC
  Administered 2023-07-25: 500 mg via ORAL
  Filled 2023-07-25: qty 1

## 2023-07-25 NOTE — ED Provider Notes (Signed)
 [facility] Provider Note    Event Date/Time   First MD Initiated Contact with Patient 07/25/23 1415     (approximate)   History   Headache   HPI  Susan Shelton is a 33 y.o. female [redacted] weeks pregnant with history of migraine, hypertension, PCOS and as listed in EMR presents to the emergency department for treatment and evaluation of headache, blurry vision, and headache. Similar headache 3 days ago after using a nicotine patch. She felt the dose was too strong and causing headache. The next day it was gone. After lunch today, headache returned and vision was a little blurry. She had her BP checked which was elevated 160s/low 100s on one reading. She didn't have a chance to check her glucose, but this morning it was 113.      Physical Exam   Triage Vital Signs: ED Triage Vitals  Encounter Vitals Group     BP 07/25/23 1321 137/67     Systolic BP Percentile --      Diastolic BP Percentile --      Pulse Rate 07/25/23 1321 92     Resp 07/25/23 1321 17     Temp 07/25/23 1321 98.2 F (36.8 C)     Temp Source 07/25/23 1321 Oral     SpO2 07/25/23 1321 100 %     Weight 07/25/23 1322 195 lb 12.3 oz (88.8 kg)     Height 07/25/23 1322 5\' 2"  (1.575 m)     Head Circumference --      Peak Flow --      Pain Score 07/25/23 1322 4     Pain Loc --      Pain Education --      Exclude from Growth Chart --     Most recent vital signs: Vitals:   07/25/23 1409 07/25/23 1430  BP: 121/61 117/64  Pulse: 97 83  Resp:  (!) 23  Temp:    SpO2: 100% 100%    General: Awake, no distress.  CV:  Good peripheral perfusion.  Resp:  Normal effort.  Abd:  No distention.  Other:     ED Results / Procedures / Treatments   Labs (all labs ordered are listed, but only abnormal results are displayed) Labs Reviewed  URINALYSIS, ROUTINE W REFLEX MICROSCOPIC - Abnormal; Notable for the following components:      Result Value   Color, Urine YELLOW (*)    APPearance  CLEAR (*)    All other components within normal limits  CBG MONITORING, ED     EKG  Not indicated.   RADIOLOGY  Image and radiology report reviewed and interpreted by me. Radiology report consistent with the same.  Not indicated.  PROCEDURES:  Critical Care performed: No  Procedures   MEDICATIONS ORDERED IN ED:  Medications  acetaminophen (TYLENOL) tablet 500 mg (500 mg Oral Given 07/25/23 1425)     IMPRESSION / MDM / ASSESSMENT AND PLAN / ED COURSE   I have reviewed the triage note.  Differential diagnosis includes, but is not limited to, hormone related headache, migraine, hyper/hypoglycemia  Patient's presentation is most consistent with acute illness / injury with system symptoms.  33 year old female presenting to the emergency department for treatment and evaluation after experiencing headache with some blurred vision and elevated blood pressure after eating lunch today.  She is [redacted] weeks pregnant.  She has had similar headaches in the past.  She took one 500 mg Tylenol today.  She was concerned  because her blood pressure was elevated 160s over low 100s.  Here, blood pressure is 121/61.  She continues to have a throbbing headache on the left frontal/temporal side.  She no longer has blurred vision.  She denies abdominal/pelvic pain, vaginal discharge, or vaginal bleeding. Glucose is 99 here. Urinalysis is without evidence of infection, protein, or glucose.  Patient reassured by results and normal BP readings while here today. She was advised to return to the ER if BP is elevated again and she is experiencing any concerning symptoms. Otherwise, she is to continue checking her glucose and BP at home as advised by her OBGYN. She will take 2 tylenol instead of 1 for headache prn.        FINAL CLINICAL IMPRESSION(S) / ED DIAGNOSES   Final diagnoses:  Acute nonintractable headache, unspecified headache type     Rx / DC Orders   ED Discharge Orders     None         Note:  This document was prepared using Dragon voice recognition software and may include unintentional dictation errors.   Chinita Pester, FNP 07/25/23 1524    Chesley Noon, MD 07/26/23 2322

## 2023-07-25 NOTE — ED Triage Notes (Signed)
 Pt here with a headache for 2 hours. Pt states her vision is slightly burry. Pt states her bp has been high, 157/103. Pt is currently [redacted] weeks pregnant. Pt denies bleeding, CP, or SOB.

## 2023-07-27 NOTE — Assessment & Plan Note (Signed)
 Patient stable.  Well controlled with current therapy.   Continue current meds.

## 2023-07-27 NOTE — Progress Notes (Signed)
 Established Patient Office Visit  Subjective:  Patient ID: Susan Shelton, female    DOB: 1990-07-28  Age: 33 y.o. MRN: [id number]  Chief Complaint  Patient presents with   Follow-up    1 mo    Patient is here today for her 1 month follow up.  She has been feeling fairly well since last appointment.    She does have additional concerns to discuss today. Labs are not due today. She does not need refills.    I have reviewed her active problem list, medication list, allergies, notes from last encounter, lab results for her appointment today.        No other concerns at this time.   Past Medical History:  Diagnosis Date   Bacterial vaginosis    Hypertension    gestational   Migraines    PCOS (polycystic ovarian syndrome) 02/2023   Pre-diabetes 02/2023    Past Surgical History:  Procedure Laterality Date   CESAREAN SECTION      Social History   Socioeconomic History   Marital status: Single    Spouse name: Not on file   Number of children: Not on file   Years of education: Not on file   Highest education level: Not on file  Occupational History   Not on file  Tobacco Use   Smoking status: Every Day    Current packs/day: 1.00    Average packs/day: 1 pack/day for 15.4 years (15.4 ttl pk-yrs)    Types: Cigarettes, Cigars, E-cigarettes    Start date: 03/17/2008   Smokeless tobacco: Never  Vaping Use   Vaping status: Never Used  Substance and Sexual Activity   Alcohol use: Not Currently    Alcohol/week: 2.0 standard drinks of alcohol    Types: 2 Shots of liquor per week    Comment: social / last use 04/2023 "shots"   Drug use: Yes    Frequency: 7.0 times per week    Types: Marijuana    Comment: daily/ counseled "trying to stop"   Sexual activity: Yes    Partners: Male    Birth control/protection: None, Injection, Pill, Implant    Comment: last bcm - ocp stopped 2014  Other Topics Concern   Not on file  Social History Narrative   Not on file    Social Drivers of Health   Financial Resource Strain: Low Risk  (07/09/2023)   Received from [facility]   Overall Financial Resource Strain (CARDIA)    Difficulty of Paying Living Expenses: Not very hard  Food Insecurity: No Food Insecurity (07/09/2023)   Received from [facility]   Hunger Vital Sign    Worried About Running Out of Food in the Last Year: Never true    Ran Out of Food in the Last Year: Never true  Transportation Needs: No Transportation Needs (07/09/2023)   Received from [facility] - Transportation    In the past 12 months, has lack of transportation kept you from medical appointments or from getting medications?: No    Lack of Transportation (Non-Medical): No  Physical Activity: Not on file  Stress: Not on file  Social Connections: Not on file  Intimate Partner Violence: Not At Risk (05/29/2023)   Humiliation, Afraid, Rape, and Kick questionnaire    Fear of Current or Ex-Partner: No    Emotionally Abused: No    Physically Abused: No    Sexually Abused: No    Family History  Problem Relation Age of Onset   Congestive Heart Failure Mother    Diabetes Mother    Hypertension Mother    Hepatitis Mother    Bipolar disorder Mother    Schizophrenia Mother    Hypertension Father    Heart disease Sister    Hypertension Sister    Irritable bowel syndrome Sister    Bipolar disorder Brother    Schizophrenia Brother    Diabetes Maternal Grandmother    Stroke Maternal Grandmother    Hypertension Paternal Grandmother     Allergies  Allergen Reactions   Doxycycline Swelling    Legs    Review of Systems  All other systems reviewed and are negative.      Objective:   BP 118/80   Pulse 79   Ht 5\' 2"  (1.575 m)   Wt 187 lb 12.8 oz (85.2 kg)   SpO2 97%   BMI 34.35 kg/m   Vitals:   05/05/23 1134  BP: 118/80  Pulse: 79  Height: 5\' 2"  (1.575 m)  Weight: 187 lb 12.8 oz (85.2 kg)  SpO2: 97%   BMI (Calculated): 34.34    Physical Exam Vitals and nursing note reviewed.  Constitutional:      Appearance: Normal appearance. She is normal weight.  HENT:     Head: Normocephalic.  Eyes:     Extraocular Movements: Extraocular movements intact.     Conjunctiva/sclera: Conjunctivae normal.     Pupils: Pupils are equal, round, and reactive to light.  Cardiovascular:     Rate and Rhythm: Normal rate.  Pulmonary:     Effort: Pulmonary effort is normal.  Neurological:     General: No focal deficit present.     Mental Status: She is alert and oriented to person, place, and time. Mental status is at baseline.  Psychiatric:        Mood and Affect: Mood normal.        Behavior: Behavior normal.        Thought Content: Thought content normal.      No results found for any visits on 05/05/23.  Recent Results (from the past 2160 hours)  POC urine preg, ED (not at [facility])     Status: Abnormal   Collection Time: 05/26/23  4:04 PM  Result Value Ref Range   Preg Test, Ur POSITIVE (A) NEGATIVE    Comment:        THE SENSITIVITY OF THIS METHODOLOGY IS >24 mIU/mL   CBC with Differential     Status: Abnormal   Collection Time: 05/26/23  4:11 PM  Result Value Ref Range   WBC 14.7 (H) 4.0 - 10.5 K/uL   RBC 4.37 3.87 - 5.11 MIL/uL   Hemoglobin 11.4 (L) 12.0 - 15.0 g/dL   HCT 96.2 95.2 - 84.1 %   MCV 82.8 80.0 - 100.0 fL   MCH 26.1 26.0 - 34.0 pg   MCHC 31.5 30.0 - 36.0 g/dL   RDW 32.4 40.1 - 02.7 %   Platelets 393 150 - 400 K/uL   nRBC 0.0 0.0 - 0.2 %   Neutrophils Relative % 55 %   Neutro Abs 8.1 (H) 1.7 - 7.7 K/uL   Lymphocytes Relative 31 %   Lymphs Abs 4.6 (H) 0.7 - 4.0 K/uL   Monocytes Relative 9 %   Monocytes Absolute 1.4 (H) 0.1 - 1.0 K/uL   Eosinophils Relative 3 %   Eosinophils Absolute 0.4 0.0 - 0.5 K/uL   Basophils Relative 1 %   Basophils  Absolute 0.1 0.0 - 0.1 K/uL   Immature Granulocytes 1 %   Abs Immature Granulocytes 0.07 0.00 - 0.07 K/uL    Comment: Performed  at [facility], [address] [place], [place] [address]  Comprehensive metabolic panel     Status: Abnormal   Collection Time: 05/26/23  4:11 PM  Result Value Ref Range   Sodium 133 (L) 135 - 145 mmol/L   Potassium 3.8 3.5 - 5.1 mmol/L   Chloride 101 98 - 111 mmol/L   CO2 23 22 - 32 mmol/L   Glucose, Bld 87 70 - 99 mg/dL    Comment: Glucose reference range applies only to samples taken after fasting for at least 8 hours.   BUN 13 6 - 20 mg/dL   Creatinine, Ser 6.04 0.44 - 1.00 mg/dL   Calcium 9.2 8.9 - 54.0 mg/dL   Total Protein 7.5 6.5 - 8.1 g/dL   Albumin 4.1 3.5 - 5.0 g/dL   AST 16 15 - 41 U/L   ALT 20 0 - 44 U/L   Alkaline Phosphatase 41 38 - 126 U/L   Total Bilirubin 0.3 0.0 - 1.2 mg/dL   GFR, Estimated >98 >11 mL/min    Comment: (NOTE) Calculated using the CKD-EPI Creatinine Equation (2021)    Anion gap 9 5 - 15    Comment: Performed at [facility], [address], [place], [place] [address]  Urinalysis, Complete w Microscopic -Urine, Clean Catch     Status: Abnormal   Collection Time: 05/26/23  4:11 PM  Result Value Ref Range   Color, Urine STRAW (A) YELLOW   APPearance CLEAR (A) CLEAR   Specific Gravity, Urine 1.012 1.005 - 1.030   pH 6.0 5.0 - 8.0   Glucose, UA NEGATIVE NEGATIVE mg/dL   Hgb urine dipstick NEGATIVE NEGATIVE   Bilirubin Urine NEGATIVE NEGATIVE   Ketones, ur NEGATIVE NEGATIVE mg/dL   Protein, ur NEGATIVE NEGATIVE mg/dL   Nitrite NEGATIVE NEGATIVE   Leukocytes,Ua NEGATIVE NEGATIVE   RBC / HPF 0-5 0 - 5 RBC/hpf   WBC, UA 0-5 0 - 5 WBC/hpf   Bacteria, UA NONE SEEN NONE SEEN   Squamous Epithelial / HPF 0 0 - 5 /HPF    Comment: Performed at [facility], [address] [place], [place] [address]  hCG, quantitative, pregnancy     Status: Abnormal   Collection Time: 05/26/23  4:11 PM  Result Value Ref Range   hCG, Beta Chain, Quant, S 12,602 (H) <5 mIU/mL    Comment:          GEST. AGE      CONC.  (mIU/mL)   <=1  WEEK        5 - 50     2 WEEKS       50 - 500     3 WEEKS       100 - 10,000     4 WEEKS     1,000 - 30,000     5 WEEKS     3,500 - 115,000   6-8 WEEKS     12,000 - 270,000    12 WEEKS     15,000 - 220,000        FEMALE AND NON-PREGNANT FEMALE:     LESS THAN 5 mIU/mL Performed at [facility], [address] [place], [place] [address]   Pregnancy, urine     Status: Abnormal   Collection Time: 05/29/23 12:00 AM  Result Value Ref  Range   Preg Test, Ur Positive (A) Negative  Urinalysis, Routine w reflex microscopic -Urine, Clean Catch     Status: Abnormal   Collection Time: 06/04/23  5:13 PM  Result Value Ref Range   Color, Urine YELLOW (A) YELLOW   APPearance CLOUDY (A) CLEAR   Specific Gravity, Urine 1.024 1.005 - 1.030   pH 7.0 5.0 - 8.0   Glucose, UA NEGATIVE NEGATIVE mg/dL   Hgb urine dipstick NEGATIVE NEGATIVE   Bilirubin Urine NEGATIVE NEGATIVE   Ketones, ur NEGATIVE NEGATIVE mg/dL   Protein, ur NEGATIVE NEGATIVE mg/dL   Nitrite NEGATIVE NEGATIVE   Leukocytes,Ua NEGATIVE NEGATIVE    Comment: Performed at [facility], [address] [place], [place] [address]  Wet prep, genital     Status: Abnormal   Collection Time: 06/04/23  5:18 PM   Specimen: Urine, Clean Catch  Result Value Ref Range   Yeast Wet Prep HPF POC NONE SEEN NONE SEEN   Trich, Wet Prep NONE SEEN NONE SEEN   Clue Cells Wet Prep HPF POC PRESENT (A) NONE SEEN   WBC, Wet Prep HPF POC <10 <10   Sperm NONE SEEN     Comment: Specimen diluted due to transport tube containing more than 1 ml of saline, interpret results with caution. Performed at [facility], [address] [place], [place] [address]   Chlamydia/NGC rt PCR [facility] only)     Status: None   Collection Time: 06/04/23  5:18 PM   Specimen: Urine, Clean Catch  Result Value Ref Range   Specimen source GC/Chlam ENDOCERVICAL    Chlamydia Tr NOT DETECTED NOT DETECTED   N gonorrhoeae NOT DETECTED NOT DETECTED    Comment:  (NOTE) This CT/NG assay has not been evaluated in patients with a history of  hysterectomy. Performed at [facility], [address] [place], [place] [address]   hCG, quantitative, pregnancy     Status: Abnormal   Collection Time: 06/04/23  9:01 PM  Result Value Ref Range   hCG, Beta Chain, Quant, S 107,369 (H) <5 mIU/mL    Comment:          GEST. AGE      CONC.  (mIU/mL)   <=1 WEEK        5 - 50     2 WEEKS       50 - 500     3 WEEKS       100 - 10,000     4 WEEKS     1,000 - 30,000     5 WEEKS     3,500 - 115,000   6-8 WEEKS     12,000 - 270,000    12 WEEKS     15,000 - 220,000        FEMALE AND NON-PREGNANT FEMALE:     LESS THAN 5 mIU/mL Performed at [facility], [address] [place], [place] [address]   ABO/Rh     Status: None   Collection Time: 06/04/23  9:01 PM  Result Value Ref Range   ABO/RH(D)      B POS Performed at [facility], [address] [place], [place] [address]   CBG monitoring, ED     Status: None   Collection Time: 07/25/23  2:27 PM  Result Value Ref Range   Glucose-Capillary 99 70 - 99 mg/dL    Comment: Glucose reference range applies only to samples taken after fasting for at least 8 hours.  Urinalysis, Routine w reflex microscopic -Urine, Clean Catch     Status: Abnormal   Collection Time: 07/25/23  2:54 PM  Result Value Ref Range   Color, Urine YELLOW (A) YELLOW   APPearance CLEAR (A) CLEAR   Specific Gravity, Urine 1.017 1.005 - 1.030   pH 6.0 5.0 - 8.0   Glucose, UA NEGATIVE NEGATIVE mg/dL   Hgb urine dipstick NEGATIVE NEGATIVE   Bilirubin Urine NEGATIVE NEGATIVE   Ketones, ur NEGATIVE NEGATIVE mg/dL   Protein, ur NEGATIVE NEGATIVE mg/dL   Nitrite NEGATIVE NEGATIVE   Leukocytes,Ua NEGATIVE NEGATIVE    Comment: Performed at [facility], [address] [place], [place] [address]       Assessment & Plan:   Problem List Items Addressed This Visit       Cardiovascular and Mediastinum    Migraine without aura and without status migrainosus, not intractable   Patient stable.  Well controlled with current therapy.   Continue current meds.          Other   Adult BMI 29.0-29.9 kg/sq m   Continue current POC.  Will adjust as needed based on results.  The patient is asked to make an attempt to improve diet and exercise patterns to aid in medical management of this problem. Addressed importance of increasing and maintaining water intake.        Prediabetes - Primary   Patient educated on foods that contain carbohydrates and the need to decrease intake.  We discussed prediabetes, and what it means and the need for strict dietary control to prevent progression to type 2 diabetes.  Advised to decrease intake of sugary drinks, including sodas, sweet tea, and some juices, and of starch and sugar heavy foods (ie., potatoes, rice, bread, pasta, desserts). She verbalizes understanding and agreement with the changes discussed today.  A1C Continues to be in prediabetic ranges.  Will reassess at follow up after next lab check.  Patient counseled on dietary choices and verbalized understanding.        Major depressive disorder, single episode, moderate (HCC)   Patient stable.  Well controlled with current therapy.   Continue current meds.        Primary insomnia   Patient stable.  Well controlled with current therapy.   Continue current meds.         Return in about 6 weeks (around 06/16/2023) for F/U.   Total time spent: 20 minutes  Miki Kins, FNP  05/05/2023   This document may have been prepared by [facility] Voice Recognition software and as such may include unintentional dictation errors.

## 2023-07-27 NOTE — Assessment & Plan Note (Signed)

## 2023-07-27 NOTE — Assessment & Plan Note (Signed)
 Continue current POC.  Will adjust as needed based on results.  The patient is asked to make an attempt to improve diet and exercise patterns to aid in medical management of this problem. Addressed importance of increasing and maintaining water intake.

## 2023-07-29 ENCOUNTER — Other Ambulatory Visit: Payer: Self-pay | Admitting: Obstetrics and Gynecology

## 2023-07-29 DIAGNOSIS — O24419 Gestational diabetes mellitus in pregnancy, unspecified control: Secondary | ICD-10-CM

## 2023-07-29 DIAGNOSIS — Z363 Encounter for antenatal screening for malformations: Secondary | ICD-10-CM

## 2023-08-13 ENCOUNTER — Encounter: Attending: Obstetrics | Admitting: Dietician

## 2023-08-13 ENCOUNTER — Encounter: Payer: Self-pay | Admitting: Dietician

## 2023-08-13 DIAGNOSIS — O24419 Gestational diabetes mellitus in pregnancy, unspecified control: Secondary | ICD-10-CM | POA: Diagnosis present

## 2023-08-13 DIAGNOSIS — Z713 Dietary counseling and surveillance: Secondary | ICD-10-CM | POA: Insufficient documentation

## 2023-08-13 NOTE — Progress Notes (Signed)
 Patient was seen on 08/13/2023 for Gestational Diabetes self-management class at the [facility]. The following learning objectives were met by the patient during this course:  States the definition of Gestational Diabetes States why dietary management is important in controlling blood glucose Describes the effects each nutrient has on blood glucose levels Demonstrates ability to create a balanced meal plan Demonstrates carbohydrate counting  States when to check blood glucose levels Demonstrates proper blood glucose monitoring techniques States the effect of stress and exercise on blood glucose levels States the importance of limiting caffeine and abstaining from alcohol and smoking Demonstrated proper insulin administration techniques  Patient has a meter prior to visit. Patient is testing pre breakfast and 2 hours after each meal. FBS: 100 - 120 mg/dL Postprandial: 295 - 284 mg/dL  Patient instructed to monitor glucose levels: FBS: 60 - <90 1 hour: <140 2 hour: <120  Patient received handouts: Nutrition Diabetes and Pregnancy General Nutrition Guidelines for GDM Blood Glucose Log Sheets Meal Building Food List  Patient will be seen for follow-up as needed.

## 2023-08-17 ENCOUNTER — Encounter: Payer: Self-pay | Admitting: Family

## 2023-08-17 NOTE — Assessment & Plan Note (Signed)
 Let patient know she can take tums if needed for her reflux.  Otherwise suggested she wait for her OB/GYN appointment for further ideas on controlling this.

## 2023-08-17 NOTE — Assessment & Plan Note (Signed)

## 2023-08-27 ENCOUNTER — Ambulatory Visit

## 2023-08-29 ENCOUNTER — Ambulatory Visit

## 2023-09-01 ENCOUNTER — Ambulatory Visit: Admitting: Obstetrics

## 2023-09-01 ENCOUNTER — Ambulatory Visit: Attending: Obstetrics

## 2023-09-01 ENCOUNTER — Other Ambulatory Visit: Payer: Self-pay

## 2023-09-01 ENCOUNTER — Other Ambulatory Visit: Payer: Self-pay | Admitting: Obstetrics

## 2023-09-01 DIAGNOSIS — O34219 Maternal care for unspecified type scar from previous cesarean delivery: Secondary | ICD-10-CM | POA: Insufficient documentation

## 2023-09-01 DIAGNOSIS — O99212 Obesity complicating pregnancy, second trimester: Secondary | ICD-10-CM | POA: Diagnosis not present

## 2023-09-01 DIAGNOSIS — O09522 Supervision of elderly multigravida, second trimester: Secondary | ICD-10-CM | POA: Diagnosis not present

## 2023-09-01 DIAGNOSIS — O24419 Gestational diabetes mellitus in pregnancy, unspecified control: Secondary | ICD-10-CM

## 2023-09-01 DIAGNOSIS — Z3A19 19 weeks gestation of pregnancy: Secondary | ICD-10-CM | POA: Insufficient documentation

## 2023-09-01 DIAGNOSIS — O09292 Supervision of pregnancy with other poor reproductive or obstetric history, second trimester: Secondary | ICD-10-CM | POA: Diagnosis not present

## 2023-09-01 DIAGNOSIS — Z363 Encounter for antenatal screening for malformations: Secondary | ICD-10-CM

## 2023-09-01 DIAGNOSIS — O24414 Gestational diabetes mellitus in pregnancy, insulin controlled: Secondary | ICD-10-CM | POA: Insufficient documentation

## 2023-09-01 DIAGNOSIS — E669 Obesity, unspecified: Secondary | ICD-10-CM | POA: Diagnosis not present

## 2023-09-01 DIAGNOSIS — Z6835 Body mass index (BMI) 35.0-35.9, adult: Secondary | ICD-10-CM

## 2023-09-01 NOTE — Progress Notes (Signed)
 MFM Consult Note  Susan Shelton is currently at 19 weeks and 3 days.  She was seen due to advanced maternal age (33 years old) and early onset gestational diabetes that is treated with Lantus insulin.  She reports that her fasting fingerstick values remain elevated.  The patient reports that she was screened for gestational diabetes early as her hemoglobin A1c was elevated at 6.4%.  She had a cell free DNA test earlier in her pregnancy which indicated a low risk for trisomy 70, 70, and 13. A female fetus is predicted.   She was informed that the fetal growth and amniotic fluid level were appropriate for her gestational age.   There were no obvious fetal anomalies noted on today's ultrasound exam.  However, today's exam was limited due to the fetal position.  The patient was informed that anomalies may be missed due to technical limitations. If the fetus is in a suboptimal position or maternal habitus is increased, visualization of the fetus in the maternal uterus may be impaired.  The following were discussed during today's consultation:  Advanced maternal age in pregnancy  The increased risk of fetal aneuploidy due to advanced maternal age was discussed.   Due to advanced maternal age, the patient was offered and declined an amniocentesis today for definitive diagnosis of fetal aneuploidy.  She is comfortable with the low risk for Down syndrome indicated by her cell free DNA test and today's normal fetal anatomy scan.  Gestational diabetes and pregnancy  The implications and management of diabetes in pregnancy was discussed in detail with the patient.    She was advised to continue to monitor her fingersticks 4 times daily (fasting and 2 hours after each meal).    She was advised that our goals for her fingerstick values are fasting values of 90-95 or less and two-hour postprandial values of 120 or less.    As her fasting fingerstick values are elevated, her evening dose of Lantus  insulin may have to be increased.  The patient was advised that getting her fingerstick values as close to these goals as possible would provide her with the most optimal obstetrical outcome.  The increased risk of polyhydramnios, fetal macrosomia, and preeclampsia associated with diabetes was also discussed.    Due to gestational diabetes, we will continue to follow her with monthly growth ultrasounds.    Weekly fetal testing should be started at 32 weeks should she require insulin or metformin for treatment.  The patient was advised that delivery for well-controlled diabetes in pregnancy is usually recommended at around 39 weeks.    Delivery at 37 weeks may be considered should her glycemic control be poor.  A follow-up growth scan was scheduled in 5 weeks.  The patient stated that all of her questions were answered today.  A total of 45 minutes was spent counseling and coordinating the care for this patient.  Greater than 50% of the time was spent in direct face-to-face contact.

## 2023-09-09 DIAGNOSIS — O99212 Obesity complicating pregnancy, second trimester: Secondary | ICD-10-CM | POA: Insufficient documentation

## 2023-09-09 DIAGNOSIS — O09522 Supervision of elderly multigravida, second trimester: Secondary | ICD-10-CM | POA: Insufficient documentation

## 2023-09-09 DIAGNOSIS — O99332 Smoking (tobacco) complicating pregnancy, second trimester: Secondary | ICD-10-CM | POA: Insufficient documentation

## 2023-09-09 DIAGNOSIS — Z8759 Personal history of other complications of pregnancy, childbirth and the puerperium: Secondary | ICD-10-CM | POA: Insufficient documentation

## 2023-09-09 DIAGNOSIS — F1290 Cannabis use, unspecified, uncomplicated: Secondary | ICD-10-CM | POA: Insufficient documentation

## 2023-09-09 DIAGNOSIS — O24414 Gestational diabetes mellitus in pregnancy, insulin controlled: Secondary | ICD-10-CM | POA: Diagnosis present

## 2023-09-09 DIAGNOSIS — Z8659 Personal history of other mental and behavioral disorders: Secondary | ICD-10-CM | POA: Insufficient documentation

## 2023-09-20 ENCOUNTER — Observation Stay
Admission: EM | Admit: 2023-09-20 | Discharge: 2023-09-20 | Disposition: A | Discharge status: Home / Self Care | Attending: Obstetrics and Gynecology | Admitting: Obstetrics and Gynecology

## 2023-09-20 ENCOUNTER — Encounter: Payer: Self-pay | Admitting: Obstetrics and Gynecology

## 2023-09-20 ENCOUNTER — Other Ambulatory Visit: Payer: Self-pay

## 2023-09-20 DIAGNOSIS — Z794 Long term (current) use of insulin: Secondary | ICD-10-CM | POA: Diagnosis not present

## 2023-09-20 DIAGNOSIS — N9489 Other specified conditions associated with female genital organs and menstrual cycle: Principal | ICD-10-CM | POA: Diagnosis present

## 2023-09-20 DIAGNOSIS — E860 Dehydration: Secondary | ICD-10-CM | POA: Insufficient documentation

## 2023-09-20 DIAGNOSIS — Z3A22 22 weeks gestation of pregnancy: Secondary | ICD-10-CM | POA: Insufficient documentation

## 2023-09-20 DIAGNOSIS — O26892 Other specified pregnancy related conditions, second trimester: Principal | ICD-10-CM | POA: Insufficient documentation

## 2023-09-20 DIAGNOSIS — Z7902 Long term (current) use of antithrombotics/antiplatelets: Secondary | ICD-10-CM | POA: Insufficient documentation

## 2023-09-20 DIAGNOSIS — O99332 Smoking (tobacco) complicating pregnancy, second trimester: Secondary | ICD-10-CM | POA: Insufficient documentation

## 2023-09-20 DIAGNOSIS — O24112 Pre-existing diabetes mellitus, type 2, in pregnancy, second trimester: Secondary | ICD-10-CM | POA: Insufficient documentation

## 2023-09-20 DIAGNOSIS — O10012 Pre-existing essential hypertension complicating pregnancy, second trimester: Secondary | ICD-10-CM | POA: Diagnosis not present

## 2023-09-20 DIAGNOSIS — F17210 Nicotine dependence, cigarettes, uncomplicated: Secondary | ICD-10-CM | POA: Diagnosis not present

## 2023-09-20 HISTORY — DX: Type 2 diabetes mellitus without complications: E11.9

## 2023-09-20 HISTORY — DX: Depression, unspecified: F32.A

## 2023-09-20 LAB — URINALYSIS, ROUTINE W REFLEX MICROSCOPIC
Bilirubin Urine: NEGATIVE
Glucose, UA: NEGATIVE mg/dL
Hgb urine dipstick: NEGATIVE
Ketones, ur: NEGATIVE mg/dL
Leukocytes,Ua: NEGATIVE
Nitrite: NEGATIVE
Protein, ur: NEGATIVE mg/dL
Specific Gravity, Urine: 1.019 (ref 1.005–1.030)
pH: 6 (ref 5.0–8.0)

## 2023-09-20 LAB — WET PREP, GENITAL
Clue Cells Wet Prep HPF POC: NONE SEEN
Sperm: NONE SEEN
Trich, Wet Prep: NONE SEEN
WBC, Wet Prep HPF POC: <10 (ref <10)
Yeast Wet Prep HPF POC: NONE SEEN

## 2023-09-20 MED ORDER — PRENATAL MULTIVITAMIN CH: 1.0000 | ORAL_TABLET | Freq: Every day | ORAL | Status: DC

## 2023-09-20 MED ORDER — ACETAMINOPHEN 325 MG PO TABS: 650.0000 mg | ORAL_TABLET | ORAL | Status: DC | PRN

## 2023-09-20 MED ORDER — CALCIUM CARBONATE ANTACID 500 MG PO CHEW: 2.0000 | CHEWABLE_TABLET | ORAL | Status: DC | PRN

## 2023-09-20 NOTE — Discharge Summary (Signed)
 HARGUN FIECHTNER is a 33 y.o. female. She is at [redacted]w[redacted]d gestation. Patient's last menstrual period was 04/18/2023 (exact date). Estimated Date of Delivery: 01/23/24  Prenatal care site: [facility]   Current pregnancy complicated by:  - early onset GDM - obesity - previous cesarean section - AMA - tobacco use - marijuana use - hx of gHTN  Chief complaint: abdominal cramping  She reports abdominal and pelvic cramping that started yesterday and has continued this morning. She describes it as a generalized crampy feeling, unable to feel distinct contractions. It radiates across her lower abdomen but is worse on her left side. She has not drank adequate PO hydration today and reports the cramping is slightly stronger today than yesterday. She had a bowel movement today but it was less than usual. She denies any UTI symptoms or abnormal vaginal discharge. She had sexual intercourse two days ago. She denies any bleeding or leaking fluid.  S: Resting comfortably. no CTX, no VB.no LOF,  Active fetal movement.  Denies: HA, visual changes, SOB, or RUQ/epigastric pain  Maternal Medical History:   Past Medical History:  Diagnosis Date   Bacterial vaginosis    Depression    Diabetes mellitus without complication (HCC)    Hypertension    gestational   Migraines    PCOS (polycystic ovarian syndrome) 02/2023   Pre-diabetes 02/2023    Past Surgical History:  Procedure Laterality Date   CESAREAN SECTION      Allergies  Allergen Reactions   Doxycycline Swelling    Legs    Prior to Admission medications   Medication Sig Start Date End Date Taking? Authorizing Provider  aspirin 81 MG chewable tablet Chew 81 mg by mouth daily.   Yes [provider]  omeprazole  (PRILOSEC) 20 MG capsule Take 20 mg by mouth daily.   Yes [provider]  Prenatal Vit-Fe Fumarate-FA (MULTIVITAMIN-PRENATAL) 27-0.8 MG TABS tablet Take 1 tablet by mouth daily at 12 noon.   Yes [provider]  sertraline (ZOLOFT) 25 MG tablet Take 25 mg by mouth daily.   Yes [provider]  Blood Glucose Monitoring Suppl (ACCU-CHEK GUIDE) w/Device KIT USE TO TEST BLOOD SUGAR FOUR TIMES DAILY 07/22/23  Yes [provider]  insulin glargine (LANTUS SOLOSTAR) 100 UNIT/ML Solostar Pen Inject 22 Units into the skin 2 (two) times daily. 08/12/23 11/10/23 Yes [provider]  Lancet Devices (SIMPLE DIAGNOSTICS LANCING DEV) MISC Use to check BS 4 times daily 07/22/23  Yes [provider]  PRECISION QID TEST test strip Use to check BS 4 times daily 07/22/23  Yes [provider]    Social History: She  reports that she has been smoking cigarettes. She started smoking about 15 years ago. She has a 15.5 pack-year smoking history. She has never used smokeless tobacco. She reports that she does not currently use alcohol after a past usage of about 2.0 standard drinks of alcohol per week. She reports that she does not currently use drugs after having used the following drugs: Marijuana. Frequency: 7.00 times per week.  Family History: family history includes Bipolar disorder in her brother and mother; Congestive Heart Failure in her mother; Diabetes in her maternal grandmother and mother; Heart disease in her sister; Hepatitis in her mother; Hypertension in her father, mother, paternal grandmother, and sister; Irritable bowel syndrome in her sister; Schizophrenia in her brother and mother; Stroke in her maternal grandmother.  no history of gyn cancers  Review of Systems: A full  review of systems was performed and negative except as noted in the HPI.     O:  BP 126/61 (BP Location: Right Arm)   Pulse 85   Temp 98.1 F (36.7 C) (Oral)   Resp 18   Ht 5\' 3"  (1.6 m)   Wt 94.3 kg   LMP 04/18/2023 (Exact Date)   BMI 36.85 kg/m  Results for orders placed or performed during the hospital encounter of 09/20/23 (from the past 48 hours)  Wet prep, genital    Collection Time: 09/20/23 11:31 AM   Specimen: Vaginal  Result Value Ref Range   Yeast Wet Prep HPF POC NONE SEEN NONE SEEN   Trich, Wet Prep NONE SEEN NONE SEEN   Clue Cells Wet Prep HPF POC NONE SEEN NONE SEEN   WBC, Wet Prep HPF POC <10 <10   Sperm NONE SEEN   Urinalysis, Routine w reflex microscopic -Urine, Clean Catch   Collection Time: 09/20/23 11:31 AM  Result Value Ref Range   Color, Urine YELLOW (A) YELLOW   APPearance CLEAR (A) CLEAR   Specific Gravity, Urine 1.019 1.005 - 1.030   pH 6.0 5.0 - 8.0   Glucose, UA NEGATIVE NEGATIVE mg/dL   Hgb urine dipstick NEGATIVE NEGATIVE   Bilirubin Urine NEGATIVE NEGATIVE   Ketones, ur NEGATIVE NEGATIVE mg/dL   Protein, ur NEGATIVE NEGATIVE mg/dL   Nitrite NEGATIVE NEGATIVE   Leukocytes,Ua NEGATIVE NEGATIVE     Constitutional: NAD, AAOx3  HE/ENT: extraocular movements grossly intact, moist mucous membranes CV: RRR PULM: nl respiratory effort, CTABL     Abd: gravid, non-tender, non-distended, soft      Ext: Non-tender, Nonedematous   Psych: mood appropriate, speech normal Pelvic: SSE done - cervix closed/thick/high, no blood or abnormal discharge present  Pelvic exam: normal external genitalia, vulva, vagina, cervix, uterus and adnexa.  Fetal  monitoring: FHR 147bpm by handheld doppler Tocometry: no contractions noted  Uterus palpates soft  A/P: 33 y.o. [redacted]w[redacted]d here for antenatal surveillance for abdominal cramping  Principle Diagnosis:  dehydration, abdominal cramping  Labor: not present. Cervix visually closed/thick, no contractions noted on toco or with palpation.  Wet prep negative, UA negative.  Cramping improved with hydration and rest. Encouraged increased PO hydration. Fetal Wellbeing: Reassuring fHR via handheld doppler D/c home stable, precautions reviewed, follow-up as scheduled.    Auston Left, CNM 09/20/2023 12:53 PM

## 2023-09-20 NOTE — OB Triage Note (Signed)
Discharge home. Left floor ambulatory. Susan Shelton  

## 2023-10-08 ENCOUNTER — Other Ambulatory Visit: Payer: Self-pay

## 2023-10-08 ENCOUNTER — Ambulatory Visit: Attending: Maternal & Fetal Medicine

## 2023-10-08 DIAGNOSIS — O09522 Supervision of elderly multigravida, second trimester: Secondary | ICD-10-CM | POA: Diagnosis not present

## 2023-10-08 DIAGNOSIS — O24414 Gestational diabetes mellitus in pregnancy, insulin controlled: Secondary | ICD-10-CM | POA: Diagnosis not present

## 2023-10-08 DIAGNOSIS — Z3A24 24 weeks gestation of pregnancy: Secondary | ICD-10-CM | POA: Insufficient documentation

## 2023-10-08 DIAGNOSIS — O99212 Obesity complicating pregnancy, second trimester: Secondary | ICD-10-CM | POA: Insufficient documentation

## 2023-10-08 DIAGNOSIS — O34219 Maternal care for unspecified type scar from previous cesarean delivery: Secondary | ICD-10-CM | POA: Insufficient documentation

## 2023-10-08 DIAGNOSIS — E669 Obesity, unspecified: Secondary | ICD-10-CM

## 2023-10-08 DIAGNOSIS — O09292 Supervision of pregnancy with other poor reproductive or obstetric history, second trimester: Secondary | ICD-10-CM | POA: Insufficient documentation

## 2023-10-08 DIAGNOSIS — Z6835 Body mass index (BMI) 35.0-35.9, adult: Secondary | ICD-10-CM

## 2023-11-04 ENCOUNTER — Other Ambulatory Visit: Payer: Self-pay

## 2023-11-04 ENCOUNTER — Observation Stay
Admission: EM | Admit: 2023-11-04 | Discharge: 2023-11-04 | Disposition: A | Discharge status: Home / Self Care | Attending: Obstetrics and Gynecology | Admitting: Obstetrics and Gynecology

## 2023-11-04 ENCOUNTER — Encounter: Payer: Self-pay | Admitting: Obstetrics and Gynecology

## 2023-11-04 DIAGNOSIS — O99333 Smoking (tobacco) complicating pregnancy, third trimester: Secondary | ICD-10-CM | POA: Insufficient documentation

## 2023-11-04 DIAGNOSIS — F17210 Nicotine dependence, cigarettes, uncomplicated: Secondary | ICD-10-CM | POA: Diagnosis not present

## 2023-11-04 DIAGNOSIS — O24113 Pre-existing diabetes mellitus, type 2, in pregnancy, third trimester: Secondary | ICD-10-CM | POA: Diagnosis not present

## 2023-11-04 DIAGNOSIS — Z3490 Encounter for supervision of normal pregnancy, unspecified, unspecified trimester: Principal | ICD-10-CM

## 2023-11-04 DIAGNOSIS — Z7982 Long term (current) use of aspirin: Secondary | ICD-10-CM | POA: Diagnosis not present

## 2023-11-04 DIAGNOSIS — O10013 Pre-existing essential hypertension complicating pregnancy, third trimester: Principal | ICD-10-CM | POA: Insufficient documentation

## 2023-11-04 DIAGNOSIS — Z3A29 29 weeks gestation of pregnancy: Secondary | ICD-10-CM | POA: Insufficient documentation

## 2023-11-04 DIAGNOSIS — Z79899 Other long term (current) drug therapy: Secondary | ICD-10-CM | POA: Diagnosis not present

## 2023-11-04 LAB — PROTEIN / CREATININE RATIO, URINE
Creatinine, Urine: 71 mg/dL
Protein Creatinine Ratio: 0.1 mg/mg{creat} (ref 0.00–0.15)
Total Protein, Urine: 7 mg/dL

## 2023-11-04 LAB — COMPREHENSIVE METABOLIC PANEL WITH GFR
ALT: 18 U/L (ref 0–44)
AST: 17 U/L (ref 15–41)
Albumin: 3.3 g/dL — ABNORMAL LOW (ref 3.5–5.0)
Alkaline Phosphatase: 52 U/L (ref 38–126)
Anion gap: 9 (ref 5–15)
BUN: 8 mg/dL (ref 6–20)
CO2: 21 mmol/L — ABNORMAL LOW (ref 22–32)
Calcium: 9.5 mg/dL (ref 8.9–10.3)
Chloride: 106 mmol/L (ref 98–111)
Creatinine, Ser: 0.5 mg/dL (ref 0.44–1.00)
GFR, Estimated: >60 mL/min (ref >60)
Glucose, Bld: 131 mg/dL — ABNORMAL HIGH (ref 70–99)
Potassium: 3.7 mmol/L (ref 3.5–5.1)
Sodium: 136 mmol/L (ref 135–145)
Total Bilirubin: 0.3 mg/dL (ref 0.0–1.2)
Total Protein: 6.9 g/dL (ref 6.5–8.1)

## 2023-11-04 LAB — CBC
HCT: 31.3 % — ABNORMAL LOW (ref 36.0–46.0)
Hemoglobin: 10.2 g/dL — ABNORMAL LOW (ref 12.0–15.0)
MCH: 28.8 pg (ref 26.0–34.0)
MCHC: 32.6 g/dL (ref 30.0–36.0)
MCV: 88.4 fL (ref 80.0–100.0)
Platelets: 419 10*3/uL — ABNORMAL HIGH (ref 150–400)
RBC: 3.54 MIL/uL — ABNORMAL LOW (ref 3.87–5.11)
RDW: 13.2 % (ref 11.5–15.5)
WBC: 12.1 10*3/uL — ABNORMAL HIGH (ref 4.0–10.5)
nRBC: 0 % (ref 0.0–0.2)

## 2023-11-04 MED ORDER — CALCIUM CARBONATE ANTACID 500 MG PO CHEW
2.0000 | CHEWABLE_TABLET | Freq: Once | ORAL | Status: AC
  Administered 2023-11-04: 400 mg via ORAL
  Filled 2023-11-04: qty 2

## 2023-11-04 NOTE — Discharge Summary (Signed)
 Susan Shelton is a 33 y.o. female. She is at [redacted]w[redacted]d gestation. Patient's last menstrual period was 04/18/2023 (exact date). Estimated Date of Delivery: 01/23/24  Prenatal care site: [facility]  Chief complaint: elevated blood pressures  HPI: Susan Shelton presents to L&D with complaints of elevated blood pressure at routine prenatal visit.  Factors complicating pregnancy: Patient Active Problem List   Diagnosis Date Noted   Pregnancy 11/04/2023   Uterine cramping 09/20/2023   Gastroesophageal reflux disease without esophagitis 04/03/2023   Primary insomnia 04/03/2023   Major depressive disorder, single episode, moderate (HCC) 01/05/2023   Positive depression screening 12/16/2019   Smoker 11-15 cpd 09/15/2019   Marijuana use 09/15/2019   HPV  09/15/2019   Adult BMI 29.0-29.9 kg/sq m 02/24/2015   Prediabetes 02/24/2015   Migraine without aura and without status migrainosus, not intractable 03/15/2014     S: Resting comfortably. no CTX, no VB.no LOF,  Active fetal movement.   Maternal Medical History:  Past Medical Hx:  has a past medical history of Bacterial vaginosis, Depression, Diabetes mellitus without complication (HCC), Hypertension, Migraines, PCOS (polycystic ovarian syndrome) (02/2023), and Pre-diabetes (02/2023).    Past Surgical Hx:  has a past surgical history that includes Cesarean section.   Allergies  Allergen Reactions   Doxycycline Swelling    Legs     Prior to Admission medications   Medication Sig Start Date End Date Taking? Authorizing Provider  buPROPion  (WELLBUTRIN  SR) 150 MG 12 hr tablet Take 150 mg by mouth 2 (two) times daily.   Yes [provider]  aspirin 81 MG chewable tablet Chew 81 mg by mouth daily.    [provider]  Blood Glucose Monitoring Suppl (ACCU-CHEK GUIDE) w/Device KIT USE TO TEST BLOOD SUGAR FOUR TIMES DAILY 07/22/23   [provider]  insulin glargine (LANTUS SOLOSTAR) 100 UNIT/ML Solostar Pen  Inject 22 Units into the skin 2 (two) times daily. 08/12/23 11/10/23  [provider]  Lancet Devices (SIMPLE DIAGNOSTICS LANCING DEV) MISC Use to check BS 4 times daily 07/22/23   [provider]  omeprazole  (PRILOSEC) 20 MG capsule Take 20 mg by mouth daily.    [provider]  PRECISION QID TEST test strip Use to check BS 4 times daily 07/22/23   [provider]  Prenatal Vit-Fe Fumarate-FA (MULTIVITAMIN-PRENATAL) 27-0.8 MG TABS tablet Take 1 tablet by mouth daily at 12 noon.    [provider]  sertraline (ZOLOFT) 25 MG tablet Take 25 mg by mouth daily. Patient not taking: Reported on 11/04/2023    [provider]    Social History: She  reports that she has been smoking cigarettes. She started smoking about 15 years ago. She has a 15 pack-year smoking history. She has never used smokeless tobacco. She reports that she does not currently use alcohol after a past usage of about 2.0 standard drinks of alcohol per week. She reports that she does not currently use drugs after having used the following drugs: Marijuana. Frequency: 7.00 times per week.  Family History: family history includes Bipolar disorder in her brother and mother; Congestive Heart Failure in her mother; Diabetes in her maternal grandmother and mother; Heart disease in her sister; Hepatitis in her mother; Hypertension in her father, mother, paternal grandmother, and sister; Irritable bowel syndrome in her sister; Schizophrenia in her brother and mother; Stroke in her maternal grandmother. , no history of gyn cancers  Review of Systems: A full review of systems was performed and negative except  as noted in the HPI.    O:  BP 129/65   Pulse 92   Temp 98 F (36.7 C) (Oral)   Resp 16   Ht 5\' 3"  (1.6 m)   Wt 96.6 kg   LMP 04/18/2023 (Exact Date)   BMI 37.73 kg/m  Results for orders placed or performed during the hospital encounter of 11/04/23 (from the past 48 hours)   Comprehensive metabolic panel   Collection Time: 11/04/23 11:23 AM  Result Value Ref Range   Sodium 136 135 - 145 mmol/L   Potassium 3.7 3.5 - 5.1 mmol/L   Chloride 106 98 - 111 mmol/L   CO2 21 (L) 22 - 32 mmol/L   Glucose, Bld 131 (H) 70 - 99 mg/dL   BUN 8 6 - 20 mg/dL   Creatinine, Ser 9.56 0.44 - 1.00 mg/dL   Calcium  9.5 8.9 - 10.3 mg/dL   Total Protein 6.9 6.5 - 8.1 g/dL   Albumin 3.3 (L) 3.5 - 5.0 g/dL   AST 17 15 - 41 U/L   ALT 18 0 - 44 U/L   Alkaline Phosphatase 52 38 - 126 U/L   Total Bilirubin 0.3 0.0 - 1.2 mg/dL   GFR, Estimated >21 >30 mL/min   Anion gap 9 5 - 15  CBC   Collection Time: 11/04/23 11:23 AM  Result Value Ref Range   WBC 12.1 (H) 4.0 - 10.5 K/uL   RBC 3.54 (L) 3.87 - 5.11 MIL/uL   Hemoglobin 10.2 (L) 12.0 - 15.0 g/dL   HCT 86.5 (L) 78.4 - 69.6 %   MCV 88.4 80.0 - 100.0 fL   MCH 28.8 26.0 - 34.0 pg   MCHC 32.6 30.0 - 36.0 g/dL   RDW 29.5 28.4 - 13.2 %   Platelets 419 (H) 150 - 400 K/uL   nRBC 0.0 0.0 - 0.2 %  Protein / creatinine ratio, urine   Collection Time: 11/04/23 11:36 AM  Result Value Ref Range   Creatinine, Urine 71 mg/dL   Total Protein, Urine 7 mg/dL   Protein Creatinine Ratio 0.10 0.00 - 0.15 mg/mg[Cre]     Constitutional: NAD, AAOx3  HE/ENT: extraocular movements grossly intact, moist mucous membranes CV: RRR PULM: nl respiratory effort, CTABL Abd: gravid, non-tender, non-distended, soft  Ext: Non-tender, Nonedmeatous Psych: mood appropriate, speech normal Pelvic : deferred SVE:     NST: Baseline FHR: 140 beats/min Variability: moderate Accelerations: present Decelerations: absent Tocometry: none Time: at least 20 minutes   Interpretation: Category I INDICATIONS: elevated blood pressure RESULTS:  A NST procedure was performed with FHR monitoring and a normal baseline established, appropriate time of 20-40 minutes of evaluation, and accels >2 seen w 15x15 characteristics.  Results show a REACTIVE NST.    Imaging  Studies: US  MFM OB FOLLOW UP Result Date: 10/08/2023 ----------------------------------------------------------------------  OBSTETRICS REPORT                       (Signed Final 10/08/2023 03:36 pm) ---------------------------------------------------------------------- Patient Info  ID #:       [id number]                          D.O.B.:  1990-09-16 (33 yrs)(F)  Name:       Susan Shelton               Visit Date: 10/08/2023 03:05 pm ---------------------------------------------------------------------- Performed By  Attending:        Jodeen Munch  Ref. Address:     [address]                    [address]                                                             [address], [place],                                                             [place] [address]  Performed By:     Fredrick Jenkins          [facility]                                                             [facility]  Referred By:      Carolynn Citrin                    MD ---------------------------------------------------------------------- Orders  #  Description                           Code        Ordered By  1  US  MFM OB FOLLOW UP                   95621.30    Elgin Grit ----------------------------------------------------------------------  #  Order #                     Accession #                Episode #  1  [id number]                   [id number]                 [id number] ---------------------------------------------------------------------- Indications  Gestational diabetes in pregnancy, insulin     O24.414  controlled  Advanced maternal age multigravida 27+,        O52.522  second trimester (33 yo)  Obesity complicating pregnancy, second         O99.212  trimester (BMI 35)  [redacted] weeks gestation of pregnancy                Z3A.24  Antenatal follow-up for nonvisualized fetal    Z36.2  anatomy  Previous cesarean  delivery, antepartum         O34.219  Poor obstetric history: Previous               O09.299  preeclampsia / eclampsia/gestational HTN  Neg MaterniT21 / Neg CF/SMA ---------------------------------------------------------------------- Fetal Evaluation  Num Of Fetuses:         1  Fetal Heart Rate(bpm):  144  Cardiac Activity:       Observed  Presentation:           Cephalic  Placenta:               Posterior  P. Cord Insertion:      Previously seen  Amniotic Fluid  AFI FV:      Within normal limits                              Largest Pocket(cm)                              6.88 ---------------------------------------------------------------------- Biometry  BPD:     63.15  mm     G. Age:  25w 4d         73  %    CI:        78.79   %    70 - 86                                                          FL/HC:      19.8   %    18.7 - 20.3  HC:    224.98   mm     G. Age:  24w 4d         23  %    HC/AC:      1.04        1.04 - 1.22  AC:    215.85   mm     G. Age:  26w 0d         81  %    FL/BPD:     70.5   %    71 - 87  FL:      44.54  mm     G. Age:  24w 5d         35  %    FL/AC:      20.6   %    20 - 24  LV:        2.4  mm  Est. FW:     805  gm    1 lb 12 oz      72  % ---------------------------------------------------------------------- OB History  Blood Type:   B+  Maternal Racial/Ethnic Group:   Black (non-Hispanic)  Gravidity:    2         Term:   1        Prem:   0        SAB:   0  TOP:          0       Ectopic:  0        Living: 1 ---------------------------------------------------------------------- Gestational Age  LMP:  24w 5d        Date:  04/18/23                 EDD:   01/23/24  U/S Today:     25w 2d                                        EDD:   01/19/24  Best:          24w 5d     Det. By:  LMP  (04/18/23)          EDD:   01/23/24 ---------------------------------------------------------------------- Targeted Anatomy  Central Nervous System  Calvarium/Cranial V.:  Appears normal          Cereb./Vermis:          Previously seen  Cavum:                 Previously seen        Sales executive:         Previously seen  Lateral Ventricles:    Previously seen        Midline Falx:           Previously seen  Choroid Plexus:        Previously seen  Spine  Cervical:              Previously seen        Sacral:                 Previously seen  Thoracic:              Previously seen        Shape/Curvature:        Previously seen  Lumbar:                Previously seen  Head/Neck  Lips:                  Previously seen        Profile:                Previously seen  Nuchal Fold:           Previously seen        Orbits/Eyes:            Previously seen  Nasal Bone:            Previously seen  Thorax  4 Chamber View:        Previously seen        SVC:                    Previously seen  Cardiac Activity:      Observed               Interventr. Septum:     Previously seen  Cardiac Rhythm:        Appears normal         Cardiac Axis:           Previously seen  Cardiac Situs:         Previously seen        Diaphragm:              Appears normal  Rt Outflow Tract:      Previously seen        3 Vessel View:  Previously seen  Lt Outflow Tract:      Appears normal         3 V Trachea View:       Previously seen  Aortic Arch:           Previously seen        IVC:                    Previously seen  Ductal Arch:           Appears normal         Crossing:               Not well visualized  Abdomen  Ventral Wall:          Previously seen        Lt Kidney:              Appears normal  Cord Insertion:        Previously seen        Rt Kidney:              Appears normal  Situs:                 Previously seen        Bladder:                Appears normal  Stomach:               Appears normal  Extremities  Lt Humerus:            Previously seen        Lt Femur:               Previously seen  Rt Humerus:            Previously seen        Rt Femur:               Previously seen  Lt Forearm:            Previously seen        Lt  Lower Leg:           Previously seen  Rt Forearm:            Previously seen        Rt Lower Leg:           Previously seen  Lt Hand:               Previously seen        Lt Foot:                Previously seen  Rt Hand:               Previously seen        Rt Foot:                Previously seen  Other  Umbilical Cord:        Previously seen        Genitalia:              Previously seen  Comment:     Fetal anatomic survey complete. ---------------------------------------------------------------------- Cervix Uterus Adnexa  Cervix  Not visualized (advanced GA >24wks)  Uterus  No abnormality visualized.  Right Ovary  Within normal limits.  Left Ovary  Within normal limits.  Cul De Sac  No free fluid seen.  Adnexa  No  abnormality visualized ---------------------------------------------------------------------- Impression  Follow up growth to complete the fetal anatomy.  Normal interval growth with measurements consistent with  dates  Good fetal movement and amniotic fluid volume  Ms. Lippard had an elevated BP of 144/77 and 144/84 mmHg  she reports that she smoked a cigarette just prior to her visit  which can affect her blood pressure.  She had no other  elevated BP in her prenatal records and is asymptomatic.  Consider PIH labs is she has BP's at future appointments. ---------------------------------------------------------------------- Recommendations  Continue serial antenatal testing and growth at her providers  office given A2GDM. ----------------------------------------------------------------------              Jodeen Munch, MD Electronically Signed Final Report   10/08/2023 03:36 pm ----------------------------------------------------------------------    Assessment: 32 y.o. [redacted]w[redacted]d here for antenatal surveillance during pregnancy.  Principle diagnosis: elevated BP in pregnancy There were no encounter diagnoses.   Plan: Labor: not present.  Fetal Wellbeing: Reassuring Cat 1 tracing. Reactive NST   PIH labs WNL BP normotensive D/c home stable, precautions reviewed, follow-up as scheduled.   ----- Arzella Laurence, CNM Certified Nurse Midwife [facility]

## 2023-11-04 NOTE — OB Triage Note (Signed)
 Pt reports to labor and delivery from the office for PIH eval. Pt states positive fetal movement. Denies vaginal bleeding and LOF.   Denies changes in vision and epigastric pain. Denies headaches. 2+ reflexes bilaterally. Absent clonus.  EFM and toco applied and assessing.

## 2023-11-04 NOTE — Progress Notes (Signed)
   11/04/23 1148  Fetal Heart Rate A  Mode External  Baseline Rate (A) 140 bpm  Variability 6-25 BPM  Accelerations 15 x 15  Decelerations None  Uterine Activity  Mode Toco  Contraction Frequency (min) none   Reactive NST.

## 2023-11-04 NOTE — OB Triage Note (Signed)
Pt discharged home per order.   Pt stable and ambulatory and an After Visit Summary was printed and given to the patient. Discharge education completed with patient/family including follow up instructions, appointments, and medication list. Pt received labor and bleeding precautions. Patient able to verbalize understanding, all questions fully answered upon discharge.   Pt discharged home via personal vehicle with support person.  

## 2023-12-04 NOTE — Progress Notes (Signed)
 NST: for GDM A2 Baseline: 135 bpm Variability: moderate Accels: >2 15x15  Decels: none Time: 20 min Category 1 NST reactive  DANIELLE RENEE WILSON CNM 12/04/2023 10:18 AM

## 2023-12-09 NOTE — Progress Notes (Signed)
 Chief Complaint:    Susan Shelton is a 33 y.o. female here for No chief complaint on file. .A2 gdm   On 17 u humulin qith meals and 40 units LAntus  bid   Glucose values still elevated   History of Present Illness    Past Medical History:  has a past medical history of Abnormal cytology, Anxiety (2024), Depression (2024), History of gestational hypertension, Migraine headache, and Obesity.  Past Surgical History:  has a past surgical history that includes Cesarean section. Family History: family history includes Bipolar disorder in her mother; Diabetes in her maternal uncle and mother; Diabetes type II in her father and mother; Heart disease in her mother; Heart failure in her mother; High blood pressure (Hypertension) in her father and mother; Mental illness in her mother; Pancreatitis in her mother; Stroke in her maternal grandmother and mother. Social History:  reports that she has been smoking cigarettes. She started smoking about 24 years ago. She has a 17.5 pack-year smoking history. She has never used smokeless tobacco. She reports that she does not currently use alcohol after a past usage of about 3.0 standard drinks of alcohol per week. She reports current drug use. Frequency: 3.00 times per week. Drug: Marijuana. OB/GYN History:  OB History     Gravida  2   Para  1   Term  1   Preterm      AB      Living  1      SAB      IAB      Ectopic      Molar      Multiple      Live Births  1          Allergies: is allergic to doxycycline. Medications:  Current Outpatient Medications:  .  aspirin 81 MG chewable tablet, Take 81 mg by mouth once daily, Disp: , Rfl:  .  blood glucose diagnostic test strip, 1 each (1 strip total) 4 (four) times daily Use as instructed., Disp: 100 strip, Rfl: 6 .  blood glucose diagnostic test strip, Use to check BS 4 times daily, Disp: 120 each, Rfl: 12 .  blood glucose meter kit, as directed, Disp: 1 each, Rfl: 0 .  blood-glucose  meter Misc, Use to check BS 4 times daily, Disp: 1 each, Rfl: 0 .  blood-glucose sensor (DEXCOM G7 SENSOR) Devi, Use 1 each every 10 (ten) days, Disp: 3 each, Rfl: 3 .  buPROPion  (WELLBUTRIN  XL) 150 MG XL tablet, Take 1 tablet (150 mg total) by mouth once daily, Disp: 90 tablet, Rfl: 3 .  insulin  GLARGINE (LANTUS  SOLOSTAR U-100 INSULIN ) pen injector (concentration 100 units/mL), Inject 35u twice daily (Patient taking differently: Inject 40 units twice daily), Disp: 15 mL, Rfl: 3 .  insulin  REGULAR (HUMULIN R  REGULAR U-100 INSULN) injection (concentration 100 units/mL), Inject 15 units of Humulin with each meal TID (Patient taking differently: Inject 17 units of Humulin with each meal TID), Disp: 10 mL, Rfl: 3 .  insulin  syringe-needle U-100 0.3 mL 31 gauge x 5/16 syringe, Use as directed, Disp: 60 each, Rfl: 3 .  lancets, Use 1 each 4 (four) times daily Use as instructed., Disp: 100 each, Rfl: 6 .  lancing device Misc, Use to check BS 4 times daily, Disp: 120 each, Rfl: 11 .  omeprazole  (PRILOSEC) 20 MG DR capsule, Take 1 capsule (20 mg total) by mouth once daily for 30 days, Disp: 30 capsule, Rfl: 0 .  prenatal no115/iron/folic acid (  PRENATAL 19 ORAL), Take 1 tablet by mouth daily with lunch, Disp: , Rfl:  .  pen needle, diabetic 31 gauge x 5/16 needle, Use as directed for up to 90 days, Disp: 100 each, Rfl: 3 .  sertraline (ZOLOFT) 25 MG tablet, Take 1 tablet (25 mg total) by mouth once daily for 30 days (Patient not taking: Reported on 09/23/2023), Disp: 30 tablet, Rfl: 0  Review of Systems: General:   No fatigue or weight loss Eyes:   No vision changes Ears:   No hearing difficulty Respiratory:                No cough or shortness of breath Pulmonary:   No asthma or shortness of breath Cardiovascular:        No chest pain, palpitations, dyspnea on exertion Gastrointestinal:          No abdominal bloating, chronic diarrhea, constipations, masses, pain or hematochezia Genitourinary:  No  hematuria, dysuria, abnormal vaginal discharge, pelvic pain, Menometrorrhagia Lymphatic:  No swollen lymph nodes Musculoskeletal: No muscle weakness Neurologic:  No extremity weakness, syncope, seizure disorder Psychiatric:  No history of depression, delusions or suicidal/homicidal ideation    Exam:   Vitals:   12/09/23 0909  BP: 113/74  Pulse: 101    Body mass index is 42.58 kg/m.  WDWN white/  female in NAD   Lungs: CTA  CV : RRR without murmur   BPP today : 8/8   Afi 17  Impression:   The primary encounter diagnosis was Supervision of high risk pregnancy, antepartum (HHS-HCC). A diagnosis of Insulin  controlled gestational diabetes mellitus (GDM) in second trimester (HHS-HCC) was also pertinent to this visit.    Plan:  Increase insulin  to 42 units Lantus  bid and 20 units humulin with meals    Assessment & Plan      No orders of the defined types were placed in this encounter.   No follow-ups on file.  Susan CLARYCE DINSMORE, MD   This note has been created using automated tools and reviewed for accuracy by [facility].

## 2023-12-11 ENCOUNTER — Other Ambulatory Visit: Payer: Self-pay

## 2023-12-11 ENCOUNTER — Observation Stay
Admission: EM | Admit: 2023-12-11 | Discharge: 2023-12-11 | Disposition: A | Discharge status: Home / Self Care | Attending: Obstetrics and Gynecology | Admitting: Obstetrics and Gynecology

## 2023-12-11 ENCOUNTER — Encounter: Payer: Self-pay | Admitting: Obstetrics and Gynecology

## 2023-12-11 DIAGNOSIS — Z794 Long term (current) use of insulin: Secondary | ICD-10-CM | POA: Diagnosis not present

## 2023-12-11 DIAGNOSIS — Z7982 Long term (current) use of aspirin: Secondary | ICD-10-CM | POA: Diagnosis not present

## 2023-12-11 DIAGNOSIS — Z1152 Encounter for screening for COVID-19: Secondary | ICD-10-CM | POA: Insufficient documentation

## 2023-12-11 DIAGNOSIS — G43909 Migraine, unspecified, not intractable, without status migrainosus: Secondary | ICD-10-CM | POA: Insufficient documentation

## 2023-12-11 DIAGNOSIS — O99333 Smoking (tobacco) complicating pregnancy, third trimester: Secondary | ICD-10-CM | POA: Diagnosis not present

## 2023-12-11 DIAGNOSIS — Z3A33 33 weeks gestation of pregnancy: Secondary | ICD-10-CM | POA: Insufficient documentation

## 2023-12-11 DIAGNOSIS — O24414 Gestational diabetes mellitus in pregnancy, insulin controlled: Secondary | ICD-10-CM | POA: Diagnosis not present

## 2023-12-11 DIAGNOSIS — O133 Gestational [pregnancy-induced] hypertension without significant proteinuria, third trimester: Secondary | ICD-10-CM | POA: Diagnosis not present

## 2023-12-11 DIAGNOSIS — O26893 Other specified pregnancy related conditions, third trimester: Secondary | ICD-10-CM | POA: Diagnosis present

## 2023-12-11 DIAGNOSIS — F17210 Nicotine dependence, cigarettes, uncomplicated: Secondary | ICD-10-CM | POA: Insufficient documentation

## 2023-12-11 DIAGNOSIS — R519 Headache, unspecified: Principal | ICD-10-CM | POA: Diagnosis present

## 2023-12-11 LAB — COMPREHENSIVE METABOLIC PANEL WITH GFR
ALT: 19 U/L (ref 0–44)
AST: 17 U/L (ref 15–41)
Albumin: 2.8 g/dL — ABNORMAL LOW (ref 3.5–5.0)
Alkaline Phosphatase: 73 U/L (ref 38–126)
Anion gap: 7 (ref 5–15)
BUN: 9 mg/dL (ref 6–20)
CO2: 19 mmol/L — ABNORMAL LOW (ref 22–32)
Calcium: 8.9 mg/dL (ref 8.9–10.3)
Chloride: 110 mmol/L (ref 98–111)
Creatinine, Ser: 0.44 mg/dL (ref 0.44–1.00)
GFR, Estimated: >60 mL/min (ref >60)
Glucose, Bld: 97 mg/dL (ref 70–99)
Potassium: 3.9 mmol/L (ref 3.5–5.1)
Sodium: 136 mmol/L (ref 135–145)
Total Bilirubin: 0.3 mg/dL (ref 0.0–1.2)
Total Protein: 6.2 g/dL — ABNORMAL LOW (ref 6.5–8.1)

## 2023-12-11 LAB — GLUCOSE, CAPILLARY: Glucose-Capillary: 110 mg/dL — ABNORMAL HIGH (ref 70–99)

## 2023-12-11 LAB — CBC
HCT: 30.7 % — ABNORMAL LOW (ref 36.0–46.0)
Hemoglobin: 10 g/dL — ABNORMAL LOW (ref 12.0–15.0)
MCH: 28.5 pg (ref 26.0–34.0)
MCHC: 32.6 g/dL (ref 30.0–36.0)
MCV: 87.5 fL (ref 80.0–100.0)
Platelets: 407 K/uL — ABNORMAL HIGH (ref 150–400)
RBC: 3.51 MIL/uL — ABNORMAL LOW (ref 3.87–5.11)
RDW: 14.2 % (ref 11.5–15.5)
WBC: 10.4 K/uL (ref 4.0–10.5)
nRBC: 0 % (ref 0.0–0.2)

## 2023-12-11 LAB — PROTEIN / CREATININE RATIO, URINE
Creatinine, Urine: 45 mg/dL
Protein Creatinine Ratio: 0.18 mg/mg{creat} — ABNORMAL HIGH (ref 0.00–0.15)
Total Protein, Urine: 8 mg/dL

## 2023-12-11 LAB — SARS CORONAVIRUS 2 BY RT PCR: SARS Coronavirus 2 by RT PCR: NEGATIVE

## 2023-12-11 MED ORDER — DIPHENHYDRAMINE HCL 50 MG/ML IJ SOLN
25.0000 mg | Freq: Once | INTRAMUSCULAR | Status: AC
  Administered 2023-12-11: 25 mg via INTRAVENOUS
  Filled 2023-12-11: qty 1

## 2023-12-11 MED ORDER — ACETAMINOPHEN 325 MG PO TABS
650.0000 mg | ORAL_TABLET | ORAL | Status: DC | PRN
  Administered 2023-12-11: 650 mg via ORAL
  Filled 2023-12-11: qty 2

## 2023-12-11 MED ORDER — LACTATED RINGERS IV BOLUS
1000.0000 mL | Freq: Once | INTRAVENOUS | Status: AC
Start: 2023-12-11 — End: 2023-12-11
  Administered 2023-12-11: 1000 mL via INTRAVENOUS

## 2023-12-11 MED ORDER — INSULIN ASPART 100 UNIT/ML IJ SOLN
20.0000 [IU] | Freq: Three times a day (TID) | INTRAMUSCULAR | Status: DC
  Administered 2023-12-11: 20 [IU] via SUBCUTANEOUS

## 2023-12-11 MED ORDER — PROCHLORPERAZINE MALEATE 10 MG PO TABS: 10.0000 mg | ORAL_TABLET | Freq: Four times a day (QID) | ORAL | 1 refills | Status: DC | PRN

## 2023-12-11 MED ORDER — MAGNESIUM OXIDE -MG SUPPLEMENT 400 (240 MG) MG PO TABS
400.0000 mg | ORAL_TABLET | Freq: Once | ORAL | Status: DC
  Filled 2023-12-11: qty 1

## 2023-12-11 MED ORDER — ACETAMINOPHEN 500 MG PO TABS: 1000.0000 mg | ORAL_TABLET | Freq: Four times a day (QID) | ORAL | Status: DC | PRN

## 2023-12-11 MED ORDER — INSULIN GLARGINE-YFGN 100 UNIT/ML ~~LOC~~ SOLN
42.0000 [IU] | Freq: Two times a day (BID) | SUBCUTANEOUS | Status: DC
Start: 2023-12-11 — End: 2023-12-11
  Administered 2023-12-11: 42 [IU] via SUBCUTANEOUS
  Filled 2023-12-11 (×2): qty 0.42

## 2023-12-11 MED ORDER — MAGNESIUM SULFATE 2 GM/50ML IV SOLN
2.0000 g | Freq: Once | INTRAVENOUS | Status: AC
  Administered 2023-12-11: 2 g via INTRAVENOUS
  Filled 2023-12-11: qty 50

## 2023-12-11 MED ORDER — DOCUSATE SODIUM 100 MG PO CAPS: 100.0000 mg | ORAL_CAPSULE | Freq: Every day | ORAL | Status: DC

## 2023-12-11 MED ORDER — DIPHENHYDRAMINE HCL 50 MG PO TABS: 25.0000 mg | ORAL_TABLET | Freq: Four times a day (QID) | ORAL | Status: DC | PRN

## 2023-12-11 MED ORDER — CALCIUM CARBONATE ANTACID 500 MG PO CHEW: 2.0000 | CHEWABLE_TABLET | ORAL | Status: DC | PRN

## 2023-12-11 MED ORDER — METOCLOPRAMIDE HCL 5 MG/ML IJ SOLN
10.0000 mg | Freq: Once | INTRAMUSCULAR | Status: AC
Start: 2023-12-11 — End: 2023-12-11
  Administered 2023-12-11: 10 mg via INTRAVENOUS
  Filled 2023-12-11 (×2): qty 2

## 2023-12-11 NOTE — Discharge Summary (Signed)
 Susan Shelton is a 33 y.o. female. She is at [redacted]w[redacted]d gestation. Patient's last menstrual period was 04/18/2023 (exact date). 01/23/2024, by Last Menstrual Period   Prenatal care site: [facility]  Chief complaint: headache   Admission Diagnoses:  1) intrauterine pregnancy at [redacted]w[redacted]d  2) Headache in pregnancy, antepartum   Discharge Diagnoses:  Principal Problem:   Headache in pregnancy, antepartum Active Problems:   Migraine   Insulin  controlled gestational diabetes mellitus (GDM) in third trimester   HPI: Susan Shelton presents to L&D with complaints of headache that started yesterday and has not resolved with Tylenol . Headache is mostly causing pain behind her eyes. She reports her blood pressure was 149/97 with her home BP cuff. She has history of migraines but states she has not had frequent headaches in current pregnancy. Her pregnancy is complicated by insulin  dependent GDM with poor control, obesity  in pregnancy, and previous cesarean delivery.  She denies Contractions, Loss of fluid, or Vaginal bleeding. Endorses fetal movement as active. Denies changes in vision or RUQ pain.   S: Resting comfortably. no CTX, no VB.no LOF,  Active fetal movement.   Maternal Medical History:  Past Medical Hx:  has a past medical history of Bacterial vaginosis, Depression, Diabetes mellitus without complication (HCC), Hypertension, Migraines, PCOS (polycystic ovarian syndrome) (02/2023), and Pre-diabetes (02/2023).    Past Surgical Hx:  has a past surgical history that includes Cesarean section.   Allergies  Allergen Reactions   Doxycycline Swelling    Legs     Prior to Admission medications   Medication Sig Start Date End Date Taking? Authorizing Provider  aspirin 81 MG chewable tablet Chew 81 mg by mouth daily.   Yes [provider]  Blood Glucose Monitoring Suppl (ACCU-CHEK GUIDE) w/Device KIT USE TO TEST BLOOD SUGAR FOUR TIMES DAILY 07/22/23  Yes [provider]   buPROPion  (WELLBUTRIN  SR) 150 MG 12 hr tablet Take 150 mg by mouth 2 (two) times daily.   Yes [provider]  diphenhydrAMINE  (BENADRYL ) 50 MG tablet Take 0.5-1 tablets (25-50 mg total) by mouth every 6 (six) hours as needed (migraine headahce in pregnancy. Take with Tylenol .). 12/11/23  Yes Susan Shelton, CNM  insulin  glargine (LANTUS  SOLOSTAR) 100 UNIT/ML Solostar Pen Inject 42 Units into the skin 2 (two) times daily. 08/12/23 12/11/23 Yes [provider]  Lancet Devices (SIMPLE DIAGNOSTICS LANCING DEV) MISC Use to check BS 4 times daily 07/22/23  Yes [provider]  omeprazole  (PRILOSEC) 20 MG capsule Take 20 mg by mouth daily.   Yes [provider]  Prenatal Vit-Fe Fumarate-FA (MULTIVITAMIN-PRENATAL) 27-0.8 MG TABS tablet Take 1 tablet by mouth daily at 12 noon.   Yes [provider]  prochlorperazine  (COMPAZINE ) 10 MG tablet Take 1 tablet (10 mg total) by mouth every 6 (six) hours as needed (migraine headache in pregnancy. Take with Tylenol ). 12/11/23  Yes Susan Shelton, CNM  acetaminophen  (TYLENOL ) 500 MG tablet Take 2 tablets (1,000 mg total) by mouth every 6 (six) hours as needed for headache (for pain scale < 4  OR  temperature  >/=  100.5 F). 12/11/23   Susan Shelton, CNM  PRECISION QID TEST test strip Use to check BS 4 times daily 07/22/23   [provider]    Social History: She  reports that she has been smoking cigarettes. She started smoking about 15 years ago. She has a 15 pack-year smoking history. She has never used smokeless tobacco. She reports that she does  not currently use alcohol after a past usage of about 2.0 standard drinks of alcohol per week. She reports that she does not currently use drugs after having used the following drugs: Marijuana. Frequency: 7.00 times per week.  Family History: family history includes Bipolar disorder in her brother and mother; Congestive Heart Failure in her mother; Diabetes in her maternal  grandmother and mother; Heart disease in her sister; Hepatitis in her mother; Hypertension in her father, mother, paternal grandmother, and sister; Irritable bowel syndrome in her sister; Schizophrenia in her brother and mother; Stroke in her maternal grandmother.   Review of Systems: A full review of systems was performed and negative except as noted in the HPI.     Pertinent Results:   O:  BP 127/76   Pulse 85   Temp 98.2 F (36.8 C) (Oral)   Resp 17   LMP 04/18/2023 (Exact Date)  Results for orders placed or performed during the hospital encounter of 12/11/23 (from the past 48 hours)  Protein / creatinine ratio, urine   Collection Time: 12/11/23  5:27 AM  Result Value Ref Range   Creatinine, Urine 45 mg/dL   Total Protein, Urine 8 mg/dL   Protein Creatinine Ratio 0.18 (H) 0.00 - 0.15 mg/mg[Cre]  CBC   Collection Time: 12/11/23  5:34 AM  Result Value Ref Range   WBC 10.4 4.0 - 10.5 K/uL   RBC 3.51 (L) 3.87 - 5.11 MIL/uL   Hemoglobin 10.0 (L) 12.0 - 15.0 g/dL   HCT 69.2 (L) 63.9 - 53.9 %   MCV 87.5 80.0 - 100.0 fL   MCH 28.5 26.0 - 34.0 pg   MCHC 32.6 30.0 - 36.0 g/dL   RDW 85.7 88.4 - 84.4 %   Platelets 407 (H) 150 - 400 K/uL   nRBC 0.0 0.0 - 0.2 %  Comprehensive metabolic panel   Collection Time: 12/11/23  5:34 AM  Result Value Ref Range   Sodium 136 135 - 145 mmol/L   Potassium 3.9 3.5 - 5.1 mmol/L   Chloride 110 98 - 111 mmol/L   CO2 19 (L) 22 - 32 mmol/L   Glucose, Bld 97 70 - 99 mg/dL   BUN 9 6 - 20 mg/dL   Creatinine, Ser 9.55 0.44 - 1.00 mg/dL   Calcium  8.9 8.9 - 10.3 mg/dL   Total Protein 6.2 (L) 6.5 - 8.1 g/dL   Albumin 2.8 (L) 3.5 - 5.0 g/dL   AST 17 15 - 41 U/L   ALT 19 0 - 44 U/L   Alkaline Phosphatase 73 38 - 126 U/L   Total Bilirubin 0.3 0.0 - 1.2 mg/dL   GFR, Estimated >39 >39 mL/min   Anion gap 7 5 - 15  SARS Coronavirus 2 by RT PCR (hospital order, performed in [facility] hospital lab) *cepheid single result test* Anterior Nasal Swab    Collection Time: 12/11/23 10:55 AM   Specimen: Anterior Nasal Swab  Result Value Ref Range   SARS Coronavirus 2 by RT PCR NEGATIVE NEGATIVE  Glucose, capillary   Collection Time: 12/11/23 11:33 AM  Result Value Ref Range   Glucose-Capillary 110 (H) 70 - 99 mg/dL    No results found.  Constitutional: NAD, AAOx3  PULM: nl respiratory effort Abd: gravid, non-tender Ext: Non-tender, Nonedmeatous Psych: mood appropriate, speech normal Pelvic : deferred  NST: Baseline FHR: 125 beats/min Variability: moderate Accelerations: present Decelerations: absent Tocometry: None Time: at least 20 minutes   Interpretation: Category I INDICATIONS: rule out uterine contractions RESULTS:  A NST procedure was performed with FHR monitoring and a normal baseline established, appropriate time of 20-40 minutes of evaluation, and accels >2 seen w 15x15 characteristics.  Results show a REACTIVE NST.   Consults: None  Procedures: NST, IV hydration   Hospital Course: The patient was admitted to Labor and Delivery Triage for observation. She was given IV hydration, IV reglan /benadryl , and IV magnesium  2 grams for headache in pregnancy. Preeclampsia labs were all normal and blood pressures were all normotensive. Reviewed instructions for how to use home BP cuff. Blood pressures were normal with home BP cuff when used per manual instructions. Headache decreased from 8/10 to 4/10 after interventions. Encouraged to monitor BP's at home and report concerns.  Comfort measures for headache along with warning s/s to report to provider were reviewed. Instructed to keep appointment next week to discuss delivery time and planned repeat c/section. Continue current insulin  regimen.  She was deemed stable for discharge and further outpatient management.   Discharge Condition: stable  Disposition: Discharge disposition: 01-Home or Self Care       Allergies as of 12/11/2023       Reactions   Doxycycline Swelling    Legs        Medication List     STOP taking these medications    sertraline 25 MG tablet Commonly known as: ZOLOFT       TAKE these medications    Accu-Chek Guide w/Device Kit USE TO TEST BLOOD SUGAR FOUR TIMES DAILY   acetaminophen  500 MG tablet Commonly known as: TYLENOL  Take 2 tablets (1,000 mg total) by mouth every 6 (six) hours as needed for headache (for pain scale < 4  OR  temperature  >/=  100.5 F).   aspirin 81 MG chewable tablet Chew 81 mg by mouth daily.   buPROPion  150 MG 12 hr tablet Commonly known as: WELLBUTRIN  SR Take 150 mg by mouth 2 (two) times daily.   diphenhydrAMINE  50 MG tablet Commonly known as: BENADRYL  Take 0.5-1 tablets (25-50 mg total) by mouth every 6 (six) hours as needed (migraine headahce in pregnancy. Take with Tylenol .).   Lantus  SoloStar 100 UNIT/ML Solostar Pen Generic drug: insulin  glargine Inject 42 Units into the skin 2 (two) times daily.   multivitamin-prenatal 27-0.8 MG Tabs tablet Take 1 tablet by mouth daily at 12 noon.   omeprazole  20 MG capsule Commonly known as: PRILOSEC Take 20 mg by mouth daily.   Precision QID Test test strip Generic drug: glucose blood Use to check BS 4 times daily   prochlorperazine  10 MG tablet Commonly known as: COMPAZINE  Take 1 tablet (10 mg total) by mouth every 6 (six) hours as needed (migraine headache in pregnancy. Take with Tylenol ).   Simple Diagnostics Lancing Dev Misc Use to check BS 4 times daily        ----- Therisa CHRISTELLA Pillow, CNM  Certified Nurse Midwife [facility]

## 2023-12-11 NOTE — OB Triage Note (Signed)
Discharge instructions, labor precautions, and follow-up care reviewed with patient. All questions answered. Patient verbalized understanding. Discharged ambulatory off unit.   

## 2023-12-11 NOTE — OB Triage Note (Signed)
 [name] 33 y.o. G2P1 @33w6dGA   presents to Labor & Delivery triage via wheelchair steered by ED staff reporting a headache that started around 1030am yesterday. Patient reports high blood pressure taken with cuff at home. Last pressure taken at home was 149/97. She has taken Tylenol  for the headache with no relief. She denies signs and symptoms consistent with rupture of membranes or active vaginal bleeding. She denies contractions and states positive fetal movement. External FM and TOCO applied to non-tender abdomen. Initial FHR 130. Vital signs obtained and within normal limits. Patient oriented to care environment including call bell and bed control use. Edsel Blush, CNM notified of patient's arrival.

## 2023-12-22 LAB — OB RESULTS CONSOLE GC/CHLAMYDIA
Chlamydia: NEGATIVE
Neisseria Gonorrhea: NEGATIVE

## 2023-12-22 LAB — OB RESULTS CONSOLE GBS: GBS: POSITIVE

## 2023-12-26 NOTE — H&P (Signed)
 Susan Shelton is a 33 y.o. female presenting for repeat ltcs  + BTL suboptimal control A2GDM .EGA 38+0  33 y.o. G2P1001 Patient's last menstrual period was 04/18/2023. consistent with ultrasound on 06/04/23 @ [redacted]w[redacted]d.  Estimated Date of Delivery: 01/23/24  Sex of baby and name:  Susan Shelton    Partner:      Factors complicating this pregnancy  Previous C/S x1 C/s done for: failure to dilate (arrest at 4 cm)/ NRFHT- 11 minute fetal bradycardic episode TJS in 2013 Desires repeat with TJS 01/09/24 A2GDM/preDM: A1c 6.1 (10/24) > 6.4 (07/09/23) A1c 6.8 7/25 Stopped Metformin  when found on pregnant May actually have T2DM given 6.4 was early in pregnancy S/p Lifestyles Meds:  Started on insulin  08/12/23- 5u QHS Increased to Lantus  10u/5u  09/09/23- Increase to 22u BID 09/23/23 - increased Lantus  to 25u BID per SDJ 10/07/23 - increased Lantus  to 35u BID per DVS 10/24/2023- Per patient, taking Lantus  35 units BID (breakfast and at night) and Humulin 11 units (breakfast and dinner) Am : 13 humulin + 35 Lantus   dinner : 13 humulin  qs : 35 units  12/01/23 : 17 units humulin with meal  40 u lantus  q am and q hs  12/09/23: 42 units lantus  BID, 20 units humulin TID 12/22/23 46 lantus  BID  humulin 24 units TID   Growth ultrasound q4w in third trimester- to be performed at [facility] rather than MFM Surveillance: NST 2x/week starting at 32 weeks - [place] Delivery Scheduled for: PP glucose tolerance testing at 4-12 weeks  Obesity Pregravid BMI: 35.22 Baseline labs- elev early 1 hr/3hr/A1c- see above, preE labs wnl (PC 73) Surveillance covered by GDM Maternal Age >40 yo Age at delivery: 47 Genetic Screening: Negative Surveillance covered by GDM Hx Migraines  Medications prior to pregnancy:none  Medications during pregnancy: None Only had a few this pregnancy Tobacco use Smoking 7-10 cigarettes per day. Working on quitting.  Surveillance covered by GDM THC use UDS Negative @ New OB Visit 09/09/23-Reports not  smoking marijuana History of Gestational HTN with last pregnancy-2013 Was on meds during last pregnancy Baseline preE labs wnl (PC 73) BP at [facility] OB 135/84 6/10 triage visit for elevated BP 156/78 - all normal in triage Depression and Anxiety Medications prior to pregnancy:Wellbutrin  150  mg 10/16/23 started back on Wellbutrin          Screening results and needs: NOB:  Medicaid Questionnaire: Completed 07/09/23 [x]  ACHD Program Depression Score:7 MBT: B positive           Ab screen: Neg  Hemoglobinopathy:  Negative HIV: Neg            RPR:NR    Hep B:Neg         Hep C:Neg  Pap:12/12/22 Neg/Neg    G/C: Neg/neg Rubella:Immune       CSC:Pfflwz  TSH:2.272          A1C: 6.4 Genetics:  MaterniT21: Negative CF/SMA carrier screening: 07/09/23 Neg/Neg AFP: negative 28 weeks:  Review Medicaid Questionnaire: []  ACHD Program Depression Score: 9  Blood consent: Signed 10/24/23 Hgb: 11.0  Platelets: 429   GTT:   n/a RPR: NR  HIV: Neg 36 weeks:  GBS: POSITIVE  G/C: neg/neg   Hgb:  Platelets:     Ultrasound:  06/04/23 ED: SIUP FHT=124 BPM. EDD of 01/23/24 c/w LMP. GA [redacted]w[redacted]d. Corpus Luteum cyst in left ovary. No FF seen.  09/01/23: MFM-variable presentation, post plac, EFW- 293 g @ 46%, AFI-WNL, follow up growth scan in  5 wks 10/08/23: cephalic, post plac, EFW- 805 g @ 72%, AFI-WNL 11/04/23-cephalic, post plac, EFW- 1329g @55 %, AFI-17.1 cm 11/26/23: cephalic, post plac, EFW- 1864 g @45 %, AFI-17.8 cm 12/01/23: cephalic, post plac, AFI- 17.1 cm, BPP 8/8 12/16/23: cephalic, post plac, EFW-2279 g @25 %, AFI- 15.9 cm, BPP 8/8 12/22/23: cephalic, post plac, AFI 20.1 cm, BPP 8/8 Immunization:   Flu in season - Received at job 02/2023 Tdap at 27-36 weeks - given 10/24/23 akc COVID- has initial series, no boosters RSV in season at 32-36 weeks - not season Contraception Plan:  BTL, consent signed 12/01/23 Feeding Plan:  Labor Plans:  OB History     Gravida  2   Para  1   Term  1   Preterm  0   AB  0    Living  1      SAB  0   IAB  0   Ectopic  0   Multiple  0   Live Births  1          Past Medical History:  Diagnosis Date   Bacterial vaginosis    Depression    Diabetes mellitus without complication (HCC)    Hypertension    gestational   Migraines    PCOS (polycystic ovarian syndrome) 02/2023   Pre-diabetes 02/2023   Past Surgical History:  Procedure Laterality Date   CESAREAN SECTION     Family History: family history includes Bipolar disorder in her brother and mother; Congestive Heart Failure in her mother; Diabetes in her maternal grandmother and mother; Heart disease in her sister; Hepatitis in her mother; Hypertension in her father, mother, paternal grandmother, and sister; Irritable bowel syndrome in her sister; Schizophrenia in her brother and mother; Stroke in her maternal grandmother. Social History:  reports that she has been smoking cigarettes. She started smoking about 15 years ago. She has a 15 pack-year smoking history. She has never used smokeless tobacco. She reports that she does not currently use alcohol after a past usage of about 2.0 standard drinks of alcohol per week. She reports that she does not currently use drugs after having used the following drugs: Marijuana. Frequency: 7.00 times per week.      Review of Systems History   Last menstrual period 04/18/2023. Exam Physical Exam  BP 127/81 on 12/29/23 Lungs CTA   CV RRR  Abd: gravid  Prenatal labs: ABO, Rh: --/--/B POS Performed at [facility], [address] [place], [place] [address]  [phone]) Antibody:   Rubella: Immune (02/12 0000) RPR: Nonreactive (02/12 0000)  HBsAg: Negative (02/12 0000)  HIV: Non-reactive (02/12 0000)  GBS:   pending  Assessment/Plan: Elective repeat and BTL . 38+0 weeks  A2gdm suboptimal control  The risks of cesarean section discussed with the patient included but were not limited to: bleeding which may require transfusion or reoperation;  infection which may require antibiotics; injury to bowel, bladder, ureters or other surrounding organs; injury to the fetus; need for additional procedures including hysterectomy in the event of a life-threatening hemorrhage; placental abnormalities wth subsequent pregnancies, incisional problems, thromboembolic phenomenon and other postoperative/anesthesia complications. The patient concurred with the proposed plan, giving informed written consent for the procedure.    Preoperative prophylactic antibiotics and SCDs ordered on call to the OR.  To OR when ready.  Debby PARAS Ashan Cueva 12/26/2023, 1:17 PM

## 2023-12-29 NOTE — Progress Notes (Signed)
 Chief Complaint:    Susan Shelton is a 33 y.o. female here for preop .36+3 weeks  C/o right Lower extremity edema and pain with ambulation   No sob  A2gdm   Insulin  48u LAntus  in am and pm and 25 units humulin tid 50%  Glucose levels still elevated    Past Medical History:  has a past medical history of Abnormal cytology, Anxiety (2024), Depression (2024), History of gestational hypertension, Migraine headache, and Obesity.  Past Surgical History:  has a past surgical history that includes Cesarean section. Family History: family history includes Bipolar disorder in her mother; Diabetes in her maternal uncle and mother; Diabetes type II in her father and mother; Heart disease in her mother; Heart failure in her mother; High blood pressure (Hypertension) in her father and mother; Mental illness in her mother; Pancreatitis in her mother; Stroke in her maternal grandmother and mother. Social History:  reports that she has been smoking cigarettes. She started smoking about 25 years ago. She has a 17.5 pack-year smoking history. She has never used smokeless tobacco. She reports that she does not currently use alcohol after a past usage of about 3.0 standard drinks of alcohol per week. She reports current drug use. Frequency: 3.00 times per week. Drug: Marijuana. OB/GYN History:  OB History     Gravida  2   Para  1   Term  1   Preterm      AB      Living  1      SAB      IAB      Ectopic      Molar      Multiple      Live Births  1          Allergies: is allergic to doxycycline. Medications:  Current Outpatient Medications:  .  aspirin 81 MG chewable tablet, Take 81 mg by mouth once daily, Disp: , Rfl:  .  blood glucose diagnostic test strip, 1 each (1 strip total) 4 (four) times daily Use as instructed., Disp: 100 strip, Rfl: 6 .  blood glucose diagnostic test strip, Use to check BS 4 times daily, Disp: 120 each, Rfl: 12 .  blood glucose meter kit, as directed,  Disp: 1 each, Rfl: 0 .  blood-glucose meter Misc, Use to check BS 4 times daily, Disp: 1 each, Rfl: 0 .  blood-glucose sensor (DEXCOM G7 SENSOR) Devi, Use 1 each every 10 (ten) days, Disp: 3 each, Rfl: 3 .  buPROPion  (WELLBUTRIN  XL) 150 MG XL tablet, Take 1 tablet (150 mg total) by mouth once daily, Disp: 90 tablet, Rfl: 3 .  insulin  GLARGINE (LANTUS  SOLOSTAR U-100 INSULIN ) pen injector (concentration 100 units/mL), Inject 46 units BID (Patient taking differently: Inject 48 units BID), Disp: 15 mL, Rfl: 3 .  insulin  REGULAR (HUMULIN R  REGULAR U-100 INSULN) injection (concentration 100 units/mL), Inject 24 units TID (Patient taking differently: Inject 25 units TID), Disp: 10 mL, Rfl: 3 .  insulin  syringe-needle U-100 0.3 mL 31 gauge x 5/16 syringe, Use as directed, Disp: 60 each, Rfl: 3 .  lancets, Use 1 each 4 (four) times daily Use as instructed., Disp: 100 each, Rfl: 6 .  lancing device Misc, Use to check BS 4 times daily, Disp: 120 each, Rfl: 11 .  prenatal no115/iron/folic acid (PRENATAL 19 ORAL), Take 1 tablet by mouth daily with lunch, Disp: , Rfl:  .  pen needle, diabetic 31 gauge x 5/16 needle, Use as directed for up  to 90 days, Disp: 100 each, Rfl: 3 .  sertraline (ZOLOFT) 25 MG tablet, Take 1 tablet (25 mg total) by mouth once daily for 30 days (Patient not taking: Reported on 09/23/2023), Disp: 30 tablet, Rfl: 0  Review of Systems: General:   No fatigue or weight loss Eyes:   No vision changes Ears:   No hearing difficulty Respiratory:                No cough or shortness of breath Pulmonary:   No asthma or shortness of breath Cardiovascular:        No chest pain, palpitations, dyspnea on exertion Gastrointestinal:          No abdominal bloating, chronic diarrhea, constipations, masses, pain or hematochezia Genitourinary:  No hematuria, dysuria, abnormal vaginal discharge, pelvic pain, Menometrorrhagia Lymphatic:  No swollen lymph nodes Musculoskeletal: No muscle weakness, right LE  pain / swelling  Neurologic:  No extremity weakness, syncope, seizure disorder Psychiatric:  No history of depression, delusions or suicidal/homicidal ideation    Exam:   Vitals:   12/29/23 1104  BP: 129/83    Body mass index is 44.33 kg/m.  WDWN white/  female in NAD   Lungs: CTA  CV : RRR without murmur    Neck:  no thyromegaly Abdomen: soft , no mass, normal active bowel sounds,  non-tender, no rebound tenderness Pelvic: tanner stage 5 ,  External genitalia: vulva /labia no lesions Urethra: no prolapse Vagina: normal physiologic d/c Cervix: no lesions, no cervical motion tenderness   Uterus: normal size shape and contour, non-tender Adnexa: no mass,  non-tender   Rectovaginal:  Rightlower extremity , neg homans , neg cords   Impression:   The primary encounter diagnosis was Supervision of high risk pregnancy, antepartum (HHS-HCC). Diagnoses of Edema of right lower extremity, Insulin  controlled gestational diabetes mellitus (GDM) in second trimester (HHS-HCC), and Pregnant state, incidental (HHS-HCC) were also pertinent to this visit.  Reassuring BPP today    Plan:  Doppler u/s right Le to r/o DVT Increase insulin  to 50 units LAntus   BiD and 27 units humulin tid Continue 2 x/ weekly testing   Repeat LTCS and btl on 01/09/24   Orders Placed This Encounter  Procedures  . CARD US  lower extremity venous right    Standing Status:   Future    Expiration Date:   12/28/2024    Reason for Procedure:   right lower extremity swelling and pain    Release to patient:   Immediate    No follow-ups on file.  DEBBY CLARYCE DINSMORE, MD

## 2023-12-30 ENCOUNTER — Other Ambulatory Visit: Payer: Self-pay | Admitting: Obstetrics and Gynecology

## 2023-12-30 DIAGNOSIS — R600 Localized edema: Secondary | ICD-10-CM

## 2023-12-31 ENCOUNTER — Ambulatory Visit
Admission: RE | Admit: 2023-12-31 | Discharge: 2023-12-31 | Disposition: A | Discharge status: Home / Self Care | Source: Ambulatory Visit | Attending: Obstetrics and Gynecology | Admitting: Obstetrics and Gynecology

## 2023-12-31 DIAGNOSIS — R600 Localized edema: Secondary | ICD-10-CM | POA: Diagnosis present

## 2024-01-06 NOTE — Progress Notes (Signed)
 A2gdm at 37+4 weeks   47 LAntus  and 27 Humulin tid . Log not brought . Many mildy elevated according to her  Good fetal movt   O;128/82 BPP8/8 Afi 17  A; fetal wel being  Suboptimal glucose control P: repeat LTCS and btl on 01/09/24. Precautions until then

## 2024-01-07 ENCOUNTER — Encounter
Admission: RE | Admit: 2024-01-07 | Discharge: 2024-01-07 | Disposition: A | Discharge status: Home / Self Care | Source: Ambulatory Visit | Attending: Obstetrics and Gynecology | Admitting: Obstetrics and Gynecology

## 2024-01-07 ENCOUNTER — Other Ambulatory Visit: Payer: Self-pay

## 2024-01-07 ENCOUNTER — Encounter: Payer: Self-pay | Admitting: Obstetrics and Gynecology

## 2024-01-07 NOTE — Patient Instructions (Addendum)
 Your procedure is scheduled on: 01/09/2024 Friday  Arrival Time: Please call Labor and Delivery the day before your scheduled C-Section to find out your arrival time. [phone].  Arrival: If your arrival time is prior to 6:00 am, please enter through the Emergency Room Entrance and you will be directed to Labor and Delivery. If your arrival time is 6:00 am or later, please enter the Medical Mall and follow the greeter's instructions.  REMEMBER: Instructions that are not followed completely may result in serious medical risk, up to and including death; or upon the discretion of your surgeon and anesthesiologist your surgery may need to be rescheduled.  Do not eat food after midnight the night before surgery.  No gum chewing or hard candies.  You may however, drink CLEAR liquids up to 2 hours before you are scheduled to arrive for your surgery. Do not drink anything within 2 hours of your scheduled arrival time.  Clear liquids include: - water   One week prior to surgery: Stop Anti-inflammatories (NSAIDS) such as Advil , Aleve , Ibuprofen , Motrin , Naproxen , Naprosyn  and Aspirin based products such as Excedrin, Goody's Powder, BC Powder. Stop ANY OVER THE COUNTER supplements until after surgery.Keep taking the pre-natal vitamins.   You may however, continue to take Tylenol  if needed for pain up until the day of surgery.   Take half dose of Lantus  at bedtime. Inject 26 units only the night before surgery.  Do not take Humulin on day of surgery.  Continue taking all of your other prescription medications up until the day of surgery.  ON THE DAY OF SURGERY ONLY TAKE THESE MEDICATIONS WITH SIPS OF WATER:  buPROPion  (WELLBUTRIN  XL)  omeprazole  (PRILOSEC) Prenatal Vit-Fe Fumarate-FA  Take aspirin as prescribed.  No Alcohol for 24 hours before or after surgery.  No Smoking including e-cigarettes for 24 hours prior to surgery.  No chewable tobacco products for at least 6 hours prior to  surgery.  No nicotine patches on the day of surgery.  Do not use any recreational drugs for at least a week prior to your surgery.  Please be advised that the combination of cocaine and anesthesia may have negative outcomes, up to and including death. If you test positive for cocaine, your surgery will be cancelled.  On the morning of surgery brush your teeth with toothpaste and water, you may rinse your mouth with mouthwash if you wish. Do not swallow any toothpaste or mouthwash.  Use CHG wipes as directed on instruction sheet.-provided for you   Do not wear jewelry, make-up, hairpins, clips or nail polish.  For welded (permanent) jewelry: bracelets, anklets, waist bands, etc.  Please have this removed prior to surgery.  If it is not removed, there is a chance that hospital personnel will need to cut it off on the day of surgery.  Do not wear lotions, powders, or perfumes.   Do not shave body hair from the neck down 48 hours before surgery.  Contact lenses, hearing aids and dentures may not be worn into surgery.  Do not bring valuables to the hospital. [facility] is not responsible for any missing/lost belongings or valuables.    Notify your doctor if there is any change in your medical condition (cold, fever, infection).  Wear comfortable clothing (specific to your surgery type) to the hospital.  After surgery, you can help prevent lung complications by doing breathing exercises.  Take deep breaths and cough every 1-2 hours. Your doctor may order a device called an Facilities manager to  help you take deep breaths. When coughing or sneezing, hold a pillow firmly against your incision with both hands. This is called "splinting." Doing this helps protect your incision. It also decreases belly discomfort.  Please call the Pre-admissions Testing Dept. at [phone] if you have any questions about these instructions.  Surgery Visitation Policy:  Visitor Passes   All  visitors, including children, need an identification sticker when visiting. These stickers must be worn where they can be seen.   Labor & Delivery  Laboring women may have one designated support person and two other visitors of any age visit. The support person must remain the same. The visitors may switch with other visitors. Visitation is permitted 24 hours per day. The designated support person or a visitor over the age of 16 may sleep overnight in the patient's room. A doula registered with [facility] for labor and delivery support is not considered a visitor. Doulas not registered with [facility] are considered visitors.  Mother Baby Unit, OB Specialty and Gynecological Care  A designated support person and three visitors of any age may visit. The three visitors may switch out. The designated support person or a visitor age 80 or older may stay overnight in the room. During the postpartum period (up to 6 weeks), if the mother is the patient, she can have her newborn stay with her if there is another support person present who can be responsible for the baby.     Preparing the Skin Before Surgery     To help prevent the risk of infection at your surgical site, we are now providing you with rinse-free Sage 2% Chlorhexidine  Gluconate (CHG) disposable wipes.  Chlorhexidine  Gluconate (CHG) Soap  o An antiseptic cleaner that kills germs and bonds with the skin to continue killing germs even after washing  o Used for showering the night before surgery and morning of surgery  The night before surgery: Shower or bathe with warm water. Do not apply perfume, lotions, powders. Wait one hour after shower. Skin should be dry and cool. Open Sage wipe package - use 6 disposable cloths. Wipe body using one cloth for the right arm, one cloth for the left arm, one cloth for the right leg, one cloth for the left leg, one cloth for the chest/abdomen area, and one cloth for the back. Do not  use on open wounds or sores. Do not use on face or genitals (private parts). If you are breast feeding, do not use on breasts. 5. Do not rinse, allow to dry. 6. Skin may feel tacky for several minutes. 7. Dress in clean clothes. 8. Place clean sheets on your bed and do not sleep with pets.  REPEAT ABOVE ON THE MORNING OF SURGERY BEFORE ARRIVING TO THE HOSPITAL.

## 2024-01-08 ENCOUNTER — Encounter
Admission: RE | Admit: 2024-01-08 | Discharge: 2024-01-08 | Disposition: A | Discharge status: Home / Self Care | Source: Ambulatory Visit | Attending: Obstetrics and Gynecology | Admitting: Obstetrics and Gynecology

## 2024-01-08 ENCOUNTER — Encounter: Payer: Self-pay | Admitting: Urgent Care

## 2024-01-08 DIAGNOSIS — Z8759 Personal history of other complications of pregnancy, childbirth and the puerperium: Secondary | ICD-10-CM | POA: Insufficient documentation

## 2024-01-08 DIAGNOSIS — Z01812 Encounter for preprocedural laboratory examination: Secondary | ICD-10-CM

## 2024-01-08 DIAGNOSIS — Z01810 Encounter for preprocedural cardiovascular examination: Secondary | ICD-10-CM | POA: Diagnosis not present

## 2024-01-08 DIAGNOSIS — Z01818 Encounter for other preprocedural examination: Secondary | ICD-10-CM | POA: Insufficient documentation

## 2024-01-08 LAB — BASIC METABOLIC PANEL WITH GFR
Anion gap: 9 (ref 5–15)
BUN: 11 mg/dL (ref 6–20)
CO2: 21 mmol/L — ABNORMAL LOW (ref 22–32)
Calcium: 9.6 mg/dL (ref 8.9–10.3)
Chloride: 104 mmol/L (ref 98–111)
Creatinine, Ser: 0.55 mg/dL (ref 0.44–1.00)
GFR, Estimated: >60 mL/min (ref >60)
Glucose, Bld: 60 mg/dL — ABNORMAL LOW (ref 70–99)
Potassium: 4 mmol/L (ref 3.5–5.1)
Sodium: 134 mmol/L — ABNORMAL LOW (ref 135–145)

## 2024-01-08 LAB — TYPE AND SCREEN
ABO/RH(D): B POS
Antibody Screen: NEGATIVE
Extend sample reason: UNDETERMINED

## 2024-01-08 LAB — CBC
HCT: 33.6 % — ABNORMAL LOW (ref 36.0–46.0)
Hemoglobin: 11 g/dL — ABNORMAL LOW (ref 12.0–15.0)
MCH: 28.4 pg (ref 26.0–34.0)
MCHC: 32.7 g/dL (ref 30.0–36.0)
MCV: 86.6 fL (ref 80.0–100.0)
Platelets: 449 K/uL — ABNORMAL HIGH (ref 150–400)
RBC: 3.88 MIL/uL (ref 3.87–5.11)
RDW: 14.5 % (ref 11.5–15.5)
WBC: 9.7 K/uL (ref 4.0–10.5)
nRBC: 0 % (ref 0.0–0.2)

## 2024-01-08 MED ORDER — SOD CITRATE-CITRIC ACID 500-334 MG/5ML PO SOLN: 30.0000 mL | ORAL | Status: AC

## 2024-01-08 MED ORDER — ORAL CARE MOUTH RINSE: 15.0000 mL | Freq: Once | OROMUCOSAL | Status: AC

## 2024-01-08 MED ORDER — CEFAZOLIN SODIUM-DEXTROSE 2-4 GM/100ML-% IV SOLN
2.0000 g | INTRAVENOUS | Status: AC
  Administered 2024-01-09: 2 g via INTRAVENOUS
  Filled 2024-01-08: qty 100

## 2024-01-08 MED ORDER — CHLORHEXIDINE GLUCONATE 0.12 % MT SOLN
15.0000 mL | Freq: Once | OROMUCOSAL | Status: AC
  Administered 2024-01-09: 15 mL via OROMUCOSAL
  Filled 2024-01-08: qty 15

## 2024-01-08 MED ORDER — ACETAMINOPHEN 500 MG PO TABS
1000.0000 mg | ORAL_TABLET | Freq: Once | ORAL | Status: AC
  Administered 2024-01-09: 1000 mg via ORAL
  Filled 2024-01-08: qty 2

## 2024-01-08 MED ORDER — LACTATED RINGERS IV SOLN: INTRAVENOUS | Status: DC

## 2024-01-09 ENCOUNTER — Inpatient Hospital Stay
Admission: RE | Admit: 2024-01-09 | Discharge: 2024-01-12 | DRG: 784 | Disposition: A | Discharge status: Home / Self Care | Payer: Self-pay | Source: Ambulatory Visit | Attending: Obstetrics | Admitting: Obstetrics

## 2024-01-09 ENCOUNTER — Inpatient Hospital Stay: Admitting: Certified Registered"

## 2024-01-09 ENCOUNTER — Other Ambulatory Visit: Payer: Self-pay

## 2024-01-09 ENCOUNTER — Inpatient Hospital Stay: Payer: Self-pay | Admitting: Urgent Care

## 2024-01-09 ENCOUNTER — Encounter
Admission: RE | Disposition: A | Discharge status: Home / Self Care | Payer: Self-pay | Source: Ambulatory Visit | Attending: Obstetrics and Gynecology

## 2024-01-09 ENCOUNTER — Encounter: Payer: Self-pay | Admitting: Obstetrics and Gynecology

## 2024-01-09 DIAGNOSIS — O99212 Obesity complicating pregnancy, second trimester: Secondary | ICD-10-CM | POA: Diagnosis present

## 2024-01-09 DIAGNOSIS — K219 Gastro-esophageal reflux disease without esophagitis: Secondary | ICD-10-CM | POA: Diagnosis present

## 2024-01-09 DIAGNOSIS — O99824 Streptococcus B carrier state complicating childbirth: Secondary | ICD-10-CM | POA: Diagnosis present

## 2024-01-09 DIAGNOSIS — O99320 Drug use complicating pregnancy, unspecified trimester: Secondary | ICD-10-CM | POA: Diagnosis present

## 2024-01-09 DIAGNOSIS — O99334 Smoking (tobacco) complicating childbirth: Secondary | ICD-10-CM | POA: Diagnosis present

## 2024-01-09 DIAGNOSIS — O09522 Supervision of elderly multigravida, second trimester: Secondary | ICD-10-CM | POA: Diagnosis present

## 2024-01-09 DIAGNOSIS — Z01818 Encounter for other preprocedural examination: Principal | ICD-10-CM

## 2024-01-09 DIAGNOSIS — F32A Depression, unspecified: Secondary | ICD-10-CM | POA: Diagnosis present

## 2024-01-09 DIAGNOSIS — O24424 Gestational diabetes mellitus in childbirth, insulin controlled: Secondary | ICD-10-CM | POA: Diagnosis present

## 2024-01-09 DIAGNOSIS — R519 Headache, unspecified: Secondary | ICD-10-CM

## 2024-01-09 DIAGNOSIS — O99344 Other mental disorders complicating childbirth: Secondary | ICD-10-CM | POA: Diagnosis present

## 2024-01-09 DIAGNOSIS — Z833 Family history of diabetes mellitus: Secondary | ICD-10-CM | POA: Diagnosis not present

## 2024-01-09 DIAGNOSIS — F419 Anxiety disorder, unspecified: Secondary | ICD-10-CM | POA: Diagnosis present

## 2024-01-09 DIAGNOSIS — O26899 Other specified pregnancy related conditions, unspecified trimester: Secondary | ICD-10-CM

## 2024-01-09 DIAGNOSIS — O9081 Anemia of the puerperium: Secondary | ICD-10-CM | POA: Diagnosis not present

## 2024-01-09 DIAGNOSIS — Z8759 Personal history of other complications of pregnancy, childbirth and the puerperium: Secondary | ICD-10-CM

## 2024-01-09 DIAGNOSIS — O99332 Smoking (tobacco) complicating pregnancy, second trimester: Secondary | ICD-10-CM | POA: Diagnosis present

## 2024-01-09 DIAGNOSIS — Z3A38 38 weeks gestation of pregnancy: Secondary | ICD-10-CM | POA: Diagnosis not present

## 2024-01-09 DIAGNOSIS — Z302 Encounter for sterilization: Secondary | ICD-10-CM | POA: Diagnosis not present

## 2024-01-09 DIAGNOSIS — O9962 Diseases of the digestive system complicating childbirth: Secondary | ICD-10-CM | POA: Diagnosis present

## 2024-01-09 DIAGNOSIS — O34219 Maternal care for unspecified type scar from previous cesarean delivery: Secondary | ICD-10-CM | POA: Diagnosis present

## 2024-01-09 DIAGNOSIS — D62 Acute posthemorrhagic anemia: Secondary | ICD-10-CM | POA: Diagnosis not present

## 2024-01-09 DIAGNOSIS — F17210 Nicotine dependence, cigarettes, uncomplicated: Secondary | ICD-10-CM | POA: Diagnosis present

## 2024-01-09 DIAGNOSIS — E66813 Obesity, class 3: Secondary | ICD-10-CM | POA: Diagnosis present

## 2024-01-09 DIAGNOSIS — Z8659 Personal history of other mental and behavioral disorders: Secondary | ICD-10-CM

## 2024-01-09 DIAGNOSIS — Z01812 Encounter for preprocedural laboratory examination: Secondary | ICD-10-CM

## 2024-01-09 DIAGNOSIS — O99214 Obesity complicating childbirth: Secondary | ICD-10-CM | POA: Diagnosis present

## 2024-01-09 DIAGNOSIS — Z9889 Other specified postprocedural states: Secondary | ICD-10-CM

## 2024-01-09 DIAGNOSIS — O34211 Maternal care for low transverse scar from previous cesarean delivery: Principal | ICD-10-CM | POA: Diagnosis present

## 2024-01-09 DIAGNOSIS — O24414 Gestational diabetes mellitus in pregnancy, insulin controlled: Secondary | ICD-10-CM | POA: Diagnosis present

## 2024-01-09 DIAGNOSIS — Z8249 Family history of ischemic heart disease and other diseases of the circulatory system: Secondary | ICD-10-CM

## 2024-01-09 DIAGNOSIS — F1290 Cannabis use, unspecified, uncomplicated: Secondary | ICD-10-CM | POA: Diagnosis present

## 2024-01-09 DIAGNOSIS — E119 Type 2 diabetes mellitus without complications: Secondary | ICD-10-CM

## 2024-01-09 HISTORY — DX: Encounter for sterilization: Z30.2

## 2024-01-09 LAB — RPR: RPR Ser Ql: NONREACTIVE

## 2024-01-09 LAB — GLUCOSE, CAPILLARY
Glucose-Capillary: 92 mg/dL (ref 70–99)
Glucose-Capillary: 85 mg/dL (ref 70–99)
Glucose-Capillary: 96 mg/dL (ref 70–99)

## 2024-01-09 SURGERY — Surgical Case
Anesthesia: Spinal | Site: Abdomen | Laterality: Bilateral

## 2024-01-09 MED ORDER — BUPIVACAINE HCL (PF) 0.25 % IJ SOLN
INTRAMUSCULAR | Status: AC
Start: 2024-01-09 — End: 2024-01-09
  Filled 2024-01-09: qty 60

## 2024-01-09 MED ORDER — NICOTINE 14 MG/24HR TD PT24
14.0000 mg | MEDICATED_PATCH | Freq: Every day | TRANSDERMAL | Status: DC
  Administered 2024-01-09 – 2024-01-12 (×4): 14 mg via TRANSDERMAL
  Filled 2024-01-09 (×4): qty 1

## 2024-01-09 MED ORDER — LIDOCAINE HCL (PF) 1 % IJ SOLN
INTRAMUSCULAR | Status: DC | PRN
  Administered 2024-01-09 (×2): 3 mL via SUBCUTANEOUS

## 2024-01-09 MED ORDER — OXYCODONE HCL 5 MG PO TABS
5.0000 mg | ORAL_TABLET | ORAL | Status: DC | PRN
  Administered 2024-01-09: 10 mg via ORAL
  Administered 2024-01-09 (×2): 5 mg via ORAL
  Administered 2024-01-10 (×3): 10 mg via ORAL
  Filled 2024-01-09: qty 1
  Filled 2024-01-09 (×3): qty 2
  Filled 2024-01-09: qty 1
  Filled 2024-01-09: qty 2

## 2024-01-09 MED ORDER — COCONUT OIL OIL
1.0000 | TOPICAL_OIL | Status: DC | PRN
  Filled 2024-01-09 (×2): qty 7.5

## 2024-01-09 MED ORDER — MORPHINE SULFATE (PF) 0.5 MG/ML IJ SOLN
INTRAMUSCULAR | Status: AC
  Filled 2024-01-09: qty 10

## 2024-01-09 MED ORDER — NALOXONE HCL 0.4 MG/ML IJ SOLN: 0.4000 mg | INTRAMUSCULAR | Status: DC | PRN

## 2024-01-09 MED ORDER — NALOXONE HCL 4 MG/10ML IJ SOLN: 1.0000 ug/kg/h | INTRAVENOUS | Status: DC | PRN

## 2024-01-09 MED ORDER — ACETAMINOPHEN 500 MG PO TABS
1000.0000 mg | ORAL_TABLET | Freq: Four times a day (QID) | ORAL | Status: DC
  Administered 2024-01-09 – 2024-01-12 (×11): 1000 mg via ORAL
  Filled 2024-01-09 (×14): qty 2

## 2024-01-09 MED ORDER — DIPHENHYDRAMINE HCL 25 MG PO CAPS: 25.0000 mg | ORAL_CAPSULE | ORAL | Status: DC | PRN

## 2024-01-09 MED ORDER — SIMETHICONE 80 MG PO CHEW: 80.0000 mg | CHEWABLE_TABLET | ORAL | Status: DC | PRN

## 2024-01-09 MED ORDER — PRENATAL MULTIVITAMIN CH
1.0000 | ORAL_TABLET | Freq: Every day | ORAL | Status: DC
  Administered 2024-01-09 – 2024-01-12 (×4): 1 via ORAL
  Filled 2024-01-09 (×4): qty 1

## 2024-01-09 MED ORDER — MORPHINE SULFATE (PF) 0.5 MG/ML IJ SOLN
INTRAMUSCULAR | Status: DC | PRN
  Administered 2024-01-09: .1 mg via EPIDURAL

## 2024-01-09 MED ORDER — FENTANYL CITRATE (PF) 100 MCG/2ML IJ SOLN
INTRAMUSCULAR | Status: AC
Start: 2024-01-09 — End: 2024-01-09
  Filled 2024-01-09: qty 2

## 2024-01-09 MED ORDER — LACTATED RINGERS IV SOLN
INTRAVENOUS | Status: DC
Start: 2024-01-09 — End: 2024-01-12

## 2024-01-09 MED ORDER — KETOROLAC TROMETHAMINE 30 MG/ML IJ SOLN
30.0000 mg | Freq: Four times a day (QID) | INTRAMUSCULAR | Status: AC | PRN
Start: 2024-01-09 — End: 2024-01-10
  Administered 2024-01-09: 30 mg via INTRAVENOUS
  Filled 2024-01-09: qty 1

## 2024-01-09 MED ORDER — 0.9 % SODIUM CHLORIDE (POUR BTL) OPTIME
TOPICAL | Status: DC | PRN
  Administered 2024-01-09: 1000 mL

## 2024-01-09 MED ORDER — DROPERIDOL 2.5 MG/ML IJ SOLN: 0.6250 mg | Freq: Once | INTRAMUSCULAR | Status: DC | PRN

## 2024-01-09 MED ORDER — BUPIVACAINE HCL (PF) 0.25 % IJ SOLN
INTRAMUSCULAR | Status: DC | PRN
  Administered 2024-01-09: 30 mL

## 2024-01-09 MED ORDER — SODIUM CHLORIDE 0.9% FLUSH: 3.0000 mL | INTRAVENOUS | Status: DC | PRN

## 2024-01-09 MED ORDER — SIMETHICONE 80 MG PO CHEW
80.0000 mg | CHEWABLE_TABLET | Freq: Three times a day (TID) | ORAL | Status: DC
  Administered 2024-01-09 – 2024-01-12 (×10): 80 mg via ORAL
  Filled 2024-01-09 (×10): qty 1

## 2024-01-09 MED ORDER — KETOROLAC TROMETHAMINE 30 MG/ML IJ SOLN
INTRAMUSCULAR | Status: DC | PRN
Start: 2024-01-09 — End: 2024-01-09
  Administered 2024-01-09: 30 mg via INTRAVENOUS

## 2024-01-09 MED ORDER — KETOROLAC TROMETHAMINE 30 MG/ML IJ SOLN: 30.0000 mg | Freq: Four times a day (QID) | INTRAMUSCULAR | Status: AC | PRN

## 2024-01-09 MED ORDER — MEPERIDINE HCL 25 MG/ML IJ SOLN: 6.2500 mg | INTRAMUSCULAR | Status: DC | PRN

## 2024-01-09 MED ORDER — ONDANSETRON HCL 4 MG/2ML IJ SOLN
INTRAMUSCULAR | Status: DC | PRN
  Administered 2024-01-09: 4 mg via INTRAVENOUS

## 2024-01-09 MED ORDER — OXYTOCIN-SODIUM CHLORIDE 30-0.9 UT/500ML-% IV SOLN: 2.5000 [IU]/h | INTRAVENOUS | Status: AC

## 2024-01-09 MED ORDER — SENNOSIDES-DOCUSATE SODIUM 8.6-50 MG PO TABS
2.0000 | ORAL_TABLET | Freq: Every day | ORAL | Status: DC
  Administered 2024-01-10 – 2024-01-12 (×3): 2 via ORAL
  Filled 2024-01-09 (×3): qty 2

## 2024-01-09 MED ORDER — MORPHINE SULFATE (PF) 2 MG/ML IV SOLN
1.0000 mg | INTRAVENOUS | Status: DC | PRN
  Administered 2024-01-10 (×3): 2 mg via INTRAVENOUS
  Filled 2024-01-09 (×3): qty 1

## 2024-01-09 MED ORDER — FENTANYL CITRATE (PF) 100 MCG/2ML IJ SOLN: 25.0000 ug | INTRAMUSCULAR | Status: DC | PRN

## 2024-01-09 MED ORDER — OXYCODONE HCL 5 MG PO TABS: 5.0000 mg | ORAL_TABLET | Freq: Four times a day (QID) | ORAL | Status: DC | PRN

## 2024-01-09 MED ORDER — DIPHENHYDRAMINE HCL 25 MG PO CAPS: 25.0000 mg | ORAL_CAPSULE | Freq: Four times a day (QID) | ORAL | Status: DC | PRN

## 2024-01-09 MED ORDER — OXYTOCIN-SODIUM CHLORIDE 30-0.9 UT/500ML-% IV SOLN
INTRAVENOUS | Status: AC
  Filled 2024-01-09: qty 500

## 2024-01-09 MED ORDER — OXYTOCIN-SODIUM CHLORIDE 30-0.9 UT/500ML-% IV SOLN
INTRAVENOUS | Status: DC | PRN
  Administered 2024-01-09: 30 [IU] via INTRAVENOUS

## 2024-01-09 MED ORDER — ENOXAPARIN SODIUM 40 MG/0.4ML IJ SOSY
40.0000 mg | PREFILLED_SYRINGE | INTRAMUSCULAR | Status: DC
  Administered 2024-01-10 – 2024-01-12 (×3): 40 mg via SUBCUTANEOUS
  Filled 2024-01-09 (×3): qty 0.4

## 2024-01-09 MED ORDER — IBUPROFEN 600 MG PO TABS
600.0000 mg | ORAL_TABLET | Freq: Four times a day (QID) | ORAL | Status: DC
  Administered 2024-01-10 – 2024-01-12 (×9): 600 mg via ORAL
  Filled 2024-01-09 (×9): qty 1

## 2024-01-09 MED ORDER — BUPIVACAINE IN DEXTROSE 0.75-8.25 % IT SOLN
INTRATHECAL | Status: DC | PRN
  Administered 2024-01-09: 1.4 mL via INTRATHECAL

## 2024-01-09 MED ORDER — GABAPENTIN 300 MG PO CAPS
300.0000 mg | ORAL_CAPSULE | Freq: Every day | ORAL | Status: DC
  Administered 2024-01-09 – 2024-01-11 (×3): 300 mg via ORAL
  Filled 2024-01-09 (×3): qty 1

## 2024-01-09 MED ORDER — SOD CITRATE-CITRIC ACID 500-334 MG/5ML PO SOLN
ORAL | Status: AC
Start: 2024-01-09 — End: 2024-01-09
  Administered 2024-01-09: 30 mL via ORAL
  Filled 2024-01-09: qty 15

## 2024-01-09 MED ORDER — KETOROLAC TROMETHAMINE 30 MG/ML IJ SOLN
30.0000 mg | Freq: Four times a day (QID) | INTRAMUSCULAR | Status: AC
  Administered 2024-01-09 – 2024-01-10 (×3): 30 mg via INTRAVENOUS
  Filled 2024-01-09 (×3): qty 1

## 2024-01-09 MED ORDER — PHENYLEPHRINE HCL-NACL 20-0.9 MG/250ML-% IV SOLN
INTRAVENOUS | Status: AC
  Filled 2024-01-09: qty 250

## 2024-01-09 MED ORDER — MENTHOL 3 MG MT LOZG: 1.0000 | LOZENGE | OROMUCOSAL | Status: DC | PRN

## 2024-01-09 MED ORDER — DIPHENHYDRAMINE HCL 50 MG/ML IJ SOLN: 12.5000 mg | INTRAMUSCULAR | Status: DC | PRN

## 2024-01-09 MED ORDER — BUPROPION HCL ER (XL) 150 MG PO TB24
150.0000 mg | ORAL_TABLET | Freq: Every day | ORAL | Status: DC
  Administered 2024-01-09 – 2024-01-12 (×4): 150 mg via ORAL
  Filled 2024-01-09 (×5): qty 1

## 2024-01-09 MED ORDER — PHENYLEPHRINE HCL-NACL 20-0.9 MG/250ML-% IV SOLN
INTRAVENOUS | Status: DC | PRN
  Administered 2024-01-09: 40 ug/min via INTRAVENOUS

## 2024-01-09 MED ORDER — ONDANSETRON HCL 4 MG/2ML IJ SOLN: 4.0000 mg | Freq: Three times a day (TID) | INTRAMUSCULAR | Status: DC | PRN

## 2024-01-09 MED ORDER — FENTANYL CITRATE (PF) 100 MCG/2ML IJ SOLN
INTRAMUSCULAR | Status: DC | PRN
  Administered 2024-01-09: 15 ug via EPIDURAL

## 2024-01-09 MED ORDER — KETOROLAC TROMETHAMINE 30 MG/ML IJ SOLN
INTRAMUSCULAR | Status: AC
  Filled 2024-01-09: qty 1

## 2024-01-09 MED ORDER — ZOLPIDEM TARTRATE 5 MG PO TABS: 5.0000 mg | ORAL_TABLET | Freq: Every evening | ORAL | Status: DC | PRN

## 2024-01-09 SURGICAL SUPPLY — 28 items
CHLORAPREP W/TINT 26 (MISCELLANEOUS) ×1 IMPLANT
DRSG TELFA 3X8 NADH STRL (GAUZE/BANDAGES/DRESSINGS) ×1 IMPLANT
ELECT CAUTERY BLADE 6.4 (BLADE) ×1 IMPLANT
ELECTRODE REM PT RTRN 9FT ADLT (ELECTROSURGICAL) ×1 IMPLANT
GAUZE SPONGE 4X4 12PLY STRL (GAUZE/BANDAGES/DRESSINGS) ×1 IMPLANT
GLOVE SURG SYN 8.0 PF PI (GLOVE) ×1 IMPLANT
GOWN STRL REUS W/ TWL LRG LVL3 (GOWN DISPOSABLE) ×2 IMPLANT
GOWN STRL REUS W/ TWL XL LVL3 (GOWN DISPOSABLE) ×1 IMPLANT
MANIFOLD NEPTUNE II (INSTRUMENTS) ×1 IMPLANT
MAT PREVALON FULL STRYKER (MISCELLANEOUS) ×1 IMPLANT
NDL HYPO 22X1.5 SAFETY MO (MISCELLANEOUS) ×1 IMPLANT
NEEDLE HYPO 22X1.5 SAFETY MO (MISCELLANEOUS) ×1 IMPLANT
NS IRRIG 1000ML POUR BTL (IV SOLUTION) ×1 IMPLANT
PACK C SECTION AR (MISCELLANEOUS) ×1 IMPLANT
PAD OB MATERNITY 11 LF (PERSONAL CARE ITEMS) ×1 IMPLANT
PAD PREP OB/GYN DISP 24X41 (PERSONAL CARE ITEMS) ×1 IMPLANT
SCRUB CHG 4% DYNA-HEX 4OZ (MISCELLANEOUS) ×1 IMPLANT
STAPLER INSORB 30 2030 C-SECTI (MISCELLANEOUS) IMPLANT
STRAP SAFETY 5IN WIDE (MISCELLANEOUS) ×1 IMPLANT
SUCT VACUUM KIWI BELL (SUCTIONS) IMPLANT
SUT CHROMIC 1 CTX 36 (SUTURE) ×3 IMPLANT
SUT PLAIN GUT 0 (SUTURE) ×2 IMPLANT
SUT VIC AB 0 CT1 36 (SUTURE) ×2 IMPLANT
SUT VIC AB 2-0 SH 27XBRD (SUTURE) IMPLANT
SYR 30ML LL (SYRINGE) ×2 IMPLANT
TAPE PAPER 3X10 WHT MICROPORE (GAUZE/BANDAGES/DRESSINGS) IMPLANT
TRAP FLUID SMOKE EVACUATOR (MISCELLANEOUS) ×1 IMPLANT
WATER STERILE IRR 500ML POUR (IV SOLUTION) ×1 IMPLANT

## 2024-01-09 NOTE — Op Note (Unsigned)
 NAMEGLEE, LASHOMB MEDICAL RECORD NO: [id number] ACCOUNT NO: [id number] DATE OF BIRTH: [birth year] FACILITY: [facility] LOCATION: [facility] PHYSICIAN: Debby DOROTHA Dinsmore, MD  Operative Report   DATE OF PROCEDURE: 01/09/2024  PREOPERATIVE DIAGNOSES: 1.  38+0 weeks estimated gestational age. 2.  A2 gestational diabetes on insulin  with suboptimal control. 3.  Previous cesarean section.  Elects for repeat cesarean section. 4.  Elected permanent sterilization.  POSTOPERATIVE DIAGNOSES: 1.  38+0 weeks estimated gestational age. 2.  A2 gestational diabetes on insulin  with suboptimal control. 3.  Previous cesarean section.  Elects for repeat cesarean section. 4.  Elected permanent sterilization. 5.  Vigorous female delivered.  PROCEDURE: 1.  Repeat low transverse cesarean section. 2.  Bilateral tubal ligation with partial right salpingectomy.  SURGEON:  Debby DOROTHA Dinsmore, MD  FIRST ASSISTANT:  Therisa Pillow, certified nurse midwife.  SECOND ASSISTANT:  PA student, Garment/textile technologist.  ANESTHESIA:  Spinal.  INDICATIONS:  This is a 33 year old gravida 2, para 1 patient with prior cesarean section.  The patient with type A2 gestational diabetes on high-dose insulin  with suboptimal control.  The patient elects for repeat cesarean section and elects for  permanent sterilization.  DESCRIPTION OF PROCEDURE:  After adequate spinal anesthesia, the patient was placed in a dorsal supine position.  The patient's hip roll was placed under the right side.  The patient's vagina was prepped with Betadine, Foley catheter placed, and the  patient's abdomen was sterilely prepped and draped.  Timeout was performed.  The patient did receive 2 g of IV Ancef  for surgical prophylaxis prior to commencement of the procedure.  A Pfannenstiel incision was made two fingerbreadths above the symphysis  pubis.  Sharp dissection was used to identify the fascia.  The fascia was opened in the midline and opened in a transverse  fashion.  The superior aspect of the fascia was grasped and the recti muscles were dissected free.  The inferior aspect of the  fascia was grasped and the pyramidalis muscle was dissected free.  Entry into the peritoneal cavity was accomplished sharply.  A bladder blade was placed and a direct low uterine transverse incision was made.  Upon entry the endometrial cavity, clear  fluid resulted.  The incision was extended with blunt transverse traction.  The fetal head was brought to the incision and fundal pressure supplied by the certified nurse midwife was given and the head, shoulders, and body were delivered without  difficulty.  A vigorous female was then dried on the mother's abdomen for 60 seconds.  The cord was doubly clamped and the vigorous female was passed to the nursery staff who assigned Apgar scores of 8 and 9.  The placenta was manually delivered followed by  exteriorization of the uterus and the endometrial cavity was wiped clean with laparotomy tape.  The uterine incision was closed with 1 chromic suture in a running locking fashion.  Two additional figure-of-eight sutures were required for hemostasis.   Attention was directed to the right fallopian tube, which was grasped in the midportion of the fallopian tube.  Of note, the distal portion of the fallopian tube was scarred down to the adnexa.  Two separate 0 plain gut sutures were placed and a 1.5 cm  portion of the fallopian tube was removed.  Good hemostasis was noted.  Attention was directed to the patient's left fallopian tube.  The distal portion was free of adhesions.  Therefore, the distal portion of the fallopian tube was doubly clamped,  transected, and doubly ligated with 2-0  Vicryl suture.  Good hemostasis was noted.  The posterior cul-de-sac was irrigated and suctioned and the uterus was placed back into the abdominal cavity.  The pericolic gutters were wiped clean with laparotomy  tape and the tubal ligation sites appeared  hemostatic as well as the uterine incision.  The fascia was then closed with 0 Vicryl suture, running, nonlocking; two separate sutures were utilized.  Good approximation of the tissues.  The fascial edges were  injected with a solution of 0.25% Marcaine .  A 30 mL solution was used.  Subcutaneous tissues were irrigated and Bovied for hemostasis and the skin was reapproximated with Insorb absorbable staples.  An additional 20 mL of Marcaine  solution was injected  beneath the skin.  Expertise by certified nurse midwife Vernel was utilized for retraction and fundal pressure during the procedure.  Her presence was necessary for the procedure.  There were no complications.  QUANTITATIVE BLOOD LOSS:  550 mL.  INTRAOPERATIVE FLUIDS:  600 mL.  URINE OUTPUT:  300 mL.  DISPOSITION:  The patient did receive 30 mg of intravenous Toradol  at the end of the procedure and was taken to the recovery room in good condition.   PUS D: 01/09/2024 9:46:51 am T: 01/09/2024 11:34:00 am  JOB: [id number]/ [id number]

## 2024-01-09 NOTE — Inpatient Diabetes Management (Signed)
 Inpatient Diabetes Program Recommendations  AACE/ADA: New Consensus Statement on Inpatient Glycemic Control (2015)  Target Ranges:  Prepandial:   less than 140 mg/dL      Peak postprandial:   less than 180 mg/dL (1-2 hours)      Critically ill patients:  140 - 180 mg/dL   Lab Results  Component Value Date   GLUCAP 96 01/09/2024   HGBA1C 6.1 (H) 03/18/2023    Review of Glycemic Control  Latest Reference Range & Units 01/09/24 05:10 01/09/24 06:49 01/09/24 10:55  Glucose-Capillary 70 - 99 mg/dL 92 85 96   Diabetes history: GDM Outpatient Diabetes medications: Lantus  52 units BID Prior to pregnancy: Metformin  500 mg BID Current orders for Inpatient glycemic control: none  Inpatient Diabetes Program Recommendations:    Noted trends prior to delivery. Within target goals.   Of note: Patient was on Metformin  prior to pregnancy. Highest A1C noted was 6.1. Consider resuming prior to pregnancy medication per PCP: Metformin  500 mg every day and checking CBGs TID.  Thanks, Tinnie Minus, MSN, RNC-OB Diabetes Coordinator [phone] (8a-5p)

## 2024-01-09 NOTE — Progress Notes (Signed)
 GBS + and varicella IMM

## 2024-01-09 NOTE — Transfer of Care (Signed)
 Immediate Anesthesia Transfer of Care Note  Patient: Susan Shelton  Procedure(s) Performed: CESAREAN SECTION, WITH BILATERAL TUBAL LIGATION (Bilateral: Abdomen)  Patient Location: PACU and Mother/Baby  Anesthesia Type:Spinal  Level of Consciousness: awake, alert , and oriented  Airway & Oxygen Therapy: Patient Spontanous Breathing  Post-op Assessment: Report given to RN and Post -op Vital signs reviewed and stable  Post vital signs: Reviewed and stable  Last Vitals:  Vitals Value Taken Time  BP 119/67 (83) 01/09/2024 0911  Temp    Pulse 86   Resp 15   SpO2 99     Last Pain:  Vitals:   01/09/24 0522  TempSrc: Oral  PainSc:          Complications: No notable events documented.

## 2024-01-09 NOTE — Anesthesia Procedure Notes (Signed)
 Date/Time: 01/09/2024 8:10 AM  Performed by: Lacretia Camelia NOVAK, CRNAPre-anesthesia Checklist: Patient identified, Emergency Drugs available, Suction available and Patient being monitored Patient Re-evaluated:Patient Re-evaluated prior to induction Oxygen Delivery Method: Nasal cannula

## 2024-01-09 NOTE — Plan of Care (Signed)

## 2024-01-09 NOTE — Lactation Note (Signed)
 This note was copied from a baby's chart. Lactation Consultation Note  Patient Name: Susan Shelton Unijb'd Date: 01/09/2024 Age:35 hours Reason for consult: L&D Initial assessment;Early term 37-38.6wks;Breastfeeding assistance   Maternal Data Has patient been taught Hand Expression?: Yes Does the patient have breastfeeding experience prior to this delivery?: Yes How long did the patient breastfeed?: 2 wks  Feeding Mother's Current Feeding Choice: Breast Milk and Formula Baby had recently breastfed 12 min on right breast, baby fuusy and rooting, assisted mom with hand expression and latch to left breast in cradle hold, baby able to latch well to left breast with sandwich and support of breast but falls off if support decreased by mom and baby gets sleepy, mom switched baby to football hold on left breast , baby still rooting but having difficulty latching if breast not sandwiched, mom continues to attempt and transition nurse in to draw first blood glucose.   LATCH Score Latch: Repeated attempts needed to sustain latch, nipple held in mouth throughout feeding, stimulation needed to elicit sucking reflex.  Audible Swallowing: None  Type of Nipple: Everted at rest and after stimulation  Comfort (Breast/Nipple): Soft / non-tender  Hold (Positioning): Assistance needed to correctly position infant at breast and maintain latch.  LATCH Score: 6   Lactation Tools Discussed/Used    Interventions Interventions: Breast feeding basics reviewed;Assisted with latch;Skin to skin;Hand express;Adjust position;Support pillows;Education  Discharge Pump: Hands Free  Consult Status Consult Status: Follow-up from L&D Date: 01/09/24 Follow-up type: In-patient    Aldona JONETTA Converse 01/09/2024, 10:17 AM

## 2024-01-09 NOTE — Anesthesia Procedure Notes (Signed)
 Spinal  Patient location during procedure: OR Start time: 01/09/2024 7:50 AM End time: 01/09/2024 8:07 AM Reason for block: surgical anesthesia Staffing Performed: anesthesiologist  Anesthesiologist: Dario Barter, MD Performed by: Lacretia Camelia NOVAK, CRNA Authorized by: Dario Barter, MD   Preanesthetic Checklist Completed: patient identified, IV checked, site marked, risks and benefits discussed, surgical consent, monitors and equipment checked, pre-op evaluation and timeout performed Spinal Block Patient position: sitting Prep: ChloraPrep Patient monitoring: heart rate, continuous pulse ox, blood pressure and cardiac monitor Approach: midline Location: L3-4 Injection technique: single-shot Needle Needle type: Introducer and Pencan  Needle gauge: 24 G Needle length: 9 cm Assessment Sensory level: T10 Events: CSF return Additional Notes 2 attempts. Sterile aseptic technique used throughout the procedure.  Negative paresthesia. Negative blood return. Positive free-flowing CSF. Expiration date of kit checked and confirmed. Patient tolerated procedure well, without complications.

## 2024-01-09 NOTE — Discharge Summary (Shared)
 Postpartum Discharge Summary  Patient Name: Susan Shelton DOB: 11-15-1990 MRN: [id number]  Date of admission: 01/09/2024 Delivery date:01/09/2024 Delivering provider: LOVETTA DEBBY PARAS Date of discharge: 01/12/2024  Primary OB: [facility] OFE:Ejupzwu'd last menstrual period was 04/18/2023 (exact date). EDC Estimated Date of Delivery: 01/23/24 Gestational Age at Delivery: [redacted]w[redacted]d   Admitting diagnosis: Encounter for maternal care for low transverse scar from previous cesarean delivery [O34.211] Request for sterilization [Z30.2] Insulin  controlled gestational diabetes mellitus in second trimester [O24.414] Previous cesarean delivery affecting pregnancy [O34.219] Post-operative state [Z98.890] Intrauterine pregnancy: [redacted]w[redacted]d     Secondary diagnosis:   Principal Problem:   Previous cesarean delivery affecting pregnancy Active Problems:   AMA (advanced maternal age) multigravida 35+, second trimester   History of depression   Insulin  controlled gestational diabetes mellitus (GDM) in third trimester   Maternal tobacco use in second trimester   Obesity affecting pregnancy in second trimester   Marijuana use during pregnancy   Post-operative state   Discharge Diagnosis: Term Pregnancy Delivered      Hospital course: Sceduled C/S   33 y.o. yo [id number] at [redacted]w[redacted]d was admitted to the hospital 01/09/2024 for scheduled cesarean section with the following indication:Elective Repeat and BTL. Delivery details are as follows:  Membrane Rupture Time/Date: 8:23 AM,01/09/2024  Delivery Method:C-Section, Low Transverse Operative Delivery:N/A Details of operation can be found in separate operative note.  Patient had a complicated postpartum course by iron deficiency anemia. She was started on oral iron supplements twice a a day.  She is ambulating, tolerating a regular diet, passing flatus, and urinating well. Patient is discharged home in stable condition on  01/12/24        Newborn  Data: Birth date:01/09/2024 Birth time:8:24 AM Gender:Female Living status:Living Apgars:8 ,9  Weight:2840 g                                              Post partum procedures:none Augmentation:: N/A Complications: None Delivery Type: repeat cesarean section, low transverse incision Anesthesia: spinal anesthesia Placenta: manual removal To Pathology: No   Prenatal Labs:  Blood type/Rh B POS   Antibody screen neg  Rubella Immune (02/12 0000)   Varicella Immune  RPR NR  HBsAg Neg  Hep C NR  HIV NR  GC neg  Chlamydia neg  Genetic screening cfDNA negative  A1C 6.1->6.4->6.8  3 hour GTT 95, 217, 183, 62  GBS Positive/-- (07/28 0000)      Magnesium  Sulfate received: No BMZ received: No Rhophylac:was not indicated MMR: was not indicated Varivax vaccine given: was not indicated Tdap vaccine: Given prenatally Flu vaccine: Given prenatally RSV vaccine: not in season   Transfusion:No  Physical exam  Vitals:   01/12/24 0338 01/12/24 0854 01/12/24 1206 01/12/24 1534  BP: 132/83 132/76 (!) 140/75 137/76  Pulse: 86 91 97 (!) 102  Resp: 16 20 20    Temp: 98 F (36.7 C) 98.5 F (36.9 C) 98.1 F (36.7 C)   TempSrc: Oral Oral Oral   SpO2: 98% 98% 98% 99%  Weight:      Height:       General: alert, cooperative, and no distress Lochia: appropriate Uterine Fundus: firm Perineum: minimal edema/intact DVT Evaluation: No evidence of DVT seen on physical exam. Negative Homan's sign. No cords or calf tenderness. No significant calf/ankle edema.  Labs: Lab Results  Component Value Date   WBC  10.8 (H) 01/10/2024   HGB 9.0 (L) 01/10/2024   HCT 28.2 (L) 01/10/2024   MCV 88.7 01/10/2024   PLT 336 01/10/2024      Latest Ref Rng & Units 01/08/2024    9:07 AM  CMP  Glucose 70 - 99 mg/dL 60   BUN 6 - 20 mg/dL 11   Creatinine 9.55 - 1.00 mg/dL 9.44   Sodium 864 - 854 mmol/L 134   Potassium 3.5 - 5.1 mmol/L 4.0   Chloride 98 - 111 mmol/L 104   CO2 22 - 32 mmol/L 21    Calcium  8.9 - 10.3 mg/dL 9.6    Edinburgh Score:    01/11/2024    7:45 PM  Edinburgh Postnatal Depression Scale Screening Tool  I have been able to laugh and see the funny side of things. 0  I have looked forward with enjoyment to things. 0  I have blamed myself unnecessarily when things went wrong. 1  I have been anxious or worried for no good reason. 2  I have felt scared or panicky for no good reason. 0  Things have been getting on top of me. 1  I have been so unhappy that I have had difficulty sleeping. 0  I have felt sad or miserable. 1  I have been so unhappy that I have been crying. 1  The thought of harming myself has occurred to me. 0  Edinburgh Postnatal Depression Scale Total 6     Postpartum VTE Prophylaxis  Recommend 6 weeks of prophylactic anticoagulation with LMWH or subcutaneous unfractionated heparin if 1 or more high risk factor is present.  Recommend 14 days of prophylactic anticoagulation with LMWH or subcutaneous unfractionated heparin if 3 or more moderate risk factors are present.   Risk assessment for postpartum VTE and prophylactic treatment: High risk factors: None Moderate risk factors: Cesarean delivery  and BMI 40-60 kg/m2  Postpartum VTE prophylaxis with LMWH not indicated    After visit meds:  Allergies as of 01/12/2024       Reactions   Doxycycline Swelling   Legs        Medication List     STOP taking these medications    Accu-Chek Guide w/Device Kit   aspirin 81 MG chewable tablet   buPROPion  150 MG 24 hr tablet Commonly known as: WELLBUTRIN  XL   diphenhydrAMINE  50 MG tablet Commonly known as: BENADRYL    HumuLIN R  100 UNIT/ML injection Generic drug: insulin  regular   Lantus  SoloStar 100 UNIT/ML Solostar Pen Generic drug: insulin  glargine   omeprazole  20 MG capsule Commonly known as: PRILOSEC   Precision QID Test test strip Generic drug: glucose blood   prochlorperazine  10 MG tablet Commonly known as: COMPAZINE     Simple Diagnostics Lancing Dev Misc       TAKE these medications    acetaminophen  500 MG tablet Commonly known as: TYLENOL  Take 2 tablets (1,000 mg total) by mouth every 6 (six) hours. What changed:  when to take this reasons to take this   coconut oil Oil Apply 1 Application topically as needed.   ferrous sulfate  325 (65 FE) MG tablet Take 1 tablet (325 mg total) by mouth 2 (two) times daily with a meal.   Hydrocortisone  2 % Crea Apply 1 Tube topically 2 (two) times daily.   ibuprofen  600 MG tablet Commonly known as: ADVIL  Take 1 tablet (600 mg total) by mouth every 6 (six) hours.   multivitamin-prenatal 27-0.8 MG Tabs tablet Take 1 tablet by mouth daily  at 12 noon.   mupirocin  cream 2 % Commonly known as: BACTROBAN  Apply topically 2 (two) times daily.   nicotine  14 mg/24hr patch Commonly known as: NICODERM CQ  - dosed in mg/24 hours Place 1 patch (14 mg total) onto the skin daily at 6 (six) AM. Start taking on: January 13, 2024   NIFEdipine  30 MG 24 hr tablet Commonly known as: ADALAT  CC Take 1 tablet (30 mg total) by mouth daily. Start taking on: January 13, 2024   oxyCODONE  5 MG immediate release tablet Commonly known as: Oxy IR/ROXICODONE  Take 1-2 tablets (5-10 mg total) by mouth every 4 (four) hours as needed for moderate pain (pain score 4-6).   senna-docusate 8.6-50 MG tablet Commonly known as: Senokot-S Take 2 tablets by mouth daily. Start taking on: January 13, 2024   simethicone  80 MG chewable tablet Commonly known as: MYLICON Chew 1 tablet (80 mg total) by mouth as needed for flatulence.       Discharge home in stable condition Infant Feeding: Bottle and Breast Infant Disposition:home with mother Discharge instruction: per After Visit Summary and Postpartum booklet. Activity: Advance as tolerated. Pelvic rest for 6 weeks.  Diet: carb modified diet Anticipated Birth Control:  Contraceptives: Tubal Ligation Postpartum Appointment:6  weeks Additional Postpartum F/U: Incision check 2 weeks  Future Appointments:No future appointments. Follow up Visit:  Follow-up Information     Schermerhorn, Debby PARAS, MD Follow up in 2 week(s).   Specialty: Obstetrics and Gynecology Why: post op visit/ mood check Contact information: [address] [place] [address] [phone]         Schermerhorn, Debby PARAS, MD Follow up in 6 week(s).   Specialty: Obstetrics and Gynecology Why: postpartum and 2 hr gtt Contact information: [address] [place] [address] [phone]         [facility] Follow up in 3 day(s).   Why: BP check Contact information: [address] [place]  [address] [phone]                Plan:  ROGINA SCHIANO was discharged to home in good condition. Follow-up appointment as directed.    Signed:  Bobbette Brunswick CNM

## 2024-01-09 NOTE — Brief Op Note (Signed)
 01/09/2024  8:58 AM  PATIENT:  Susan Shelton  33 y.o. female  PRE-OPERATIVE DIAGNOSIS:  elective repeat cesarean sterilization  poor control A2GDM 38+0 weeks POST-OPERATIVE DIAGNOSIS:same as aboave   Vigorous female delivered   PROCEDURE:  Procedure(s) with comments: CESAREAN SECTION, WITH BILATERAL TUBAL LIGATION (Bilateral) - REPEAT CSECTION WITH BILATERAL TUBAL LIGATION  SURGEON:  Surgeons and Role:    * Austynn Pridmore, Debby PARAS, MD - Primary  PHYSICIAN ASSISTANT:Anna MAckie CNM     ASSISTANTS: Pa student [place]    ANESTHESIA:   spinal  EBL:  qbl 550 cc  IOF 600 uo 300 cc   BLOOD ADMINISTERED:none  DRAINS: Urinary Catheter (Foley)   LOCAL MEDICATIONS USED:  MARCAINE      SPECIMEN:  Source of Specimen:  portion right and left tubes   DISPOSITION OF SPECIMEN:  PATHOLOGY  COUNTS:  YES  TOURNIQUET:  * No tourniquets in log *  DICTATION: .Other Dictation: Dictation Number verbal  PLAN OF CARE: Admit to inpatient   PATIENT DISPOSITION:  PACU - hemodynamically stable.   Delay start of Pharmacological VTE agent (>24hrs) due to surgical blood loss or risk of bleeding: not applicable

## 2024-01-09 NOTE — Anesthesia Preprocedure Evaluation (Signed)
 Anesthesia Evaluation  Patient identified by MRN, date of birth, ID band Patient awake    Reviewed: Allergy & Precautions, H&P , NPO status , Patient's Chart, lab work & pertinent test results, reviewed documented beta blocker date and time   History of Anesthesia Complications Negative for: history of anesthetic complications  Airway Mallampati: III  TM Distance: >3 FB Neck ROM: full    Dental  (+) Dental Advidsory Given, Missing   Pulmonary neg shortness of breath, sleep apnea , neg COPD, neg recent URI, Current Smoker   Pulmonary exam normal breath sounds clear to auscultation       Cardiovascular Exercise Tolerance: Good hypertension, (-) angina (-) Past MI and (-) Cardiac Stents Normal cardiovascular exam(-) dysrhythmias (-) Valvular Problems/Murmurs Rhythm:regular Rate:Normal     Neuro/Psych  PSYCHIATRIC DISORDERS  Depression    negative neurological ROS     GI/Hepatic Neg liver ROS,GERD  ,,  Endo/Other  diabetes  Class 3 obesity  Renal/GU negative Renal ROS  negative genitourinary   Musculoskeletal   Abdominal   Peds  Hematology negative hematology ROS (+)   Anesthesia Other Findings Past Medical History: No date: Bacterial vaginosis No date: Delivery by elective cesarean section No date: Depression No date: Diabetes mellitus without complication (HCC) No date: Hypertension     Comment:  gestational No date: Migraines 02/2023: PCOS (polycystic ovarian syndrome) 02/2023: Pre-diabetes No date: Request for sterilization   Reproductive/Obstetrics negative OB ROS                              Anesthesia Physical Anesthesia Plan  ASA: 3  Anesthesia Plan: Spinal   Post-op Pain Management:    Induction:   PONV Risk Score and Plan:   Airway Management Planned: Natural Airway  Additional Equipment:   Intra-op Plan:   Post-operative Plan:   Informed Consent: I have  reviewed the patients History and Physical, chart, labs and discussed the procedure including the risks, benefits and alternatives for the proposed anesthesia with the patient or authorized representative who has indicated his/her understanding and acceptance.     Dental Advisory Given  Plan Discussed with: Anesthesiologist, CRNA and Surgeon  Anesthesia Plan Comments:          Anesthesia Quick Evaluation

## 2024-01-09 NOTE — Inpatient Diabetes Management (Deleted)
 Inpatient Diabetes Program Recommendations  AACE/ADA: New Consensus Statement on Inpatient Glycemic Control (2015)  Target Ranges:  Prepandial:   less than 140 mg/dL      Peak postprandial:   less than 180 mg/dL (1-2 hours)      Critically ill patients:  140 - 180 mg/dL     Latest Reference Range & Units 01/09/24 05:10 01/09/24 06:49 01/09/24 10:55  Glucose-Capillary 70 - 99 mg/dL 92 85 96     Received the following consult: Insulin  controlled GDM. Now postpartum. Likely Type II DM. Had an A1C of 6.4 early in pregnancy and then 6.8 done 11/27/2023. Previously took Metformin  prior to pregnancy (indication?). Planning fasting CBG tomorrow morning. Would appreciate recommendations for postpartum.    A1c was 6.4% (07/09/2023)  Looks like pt was started on Lantus  insulin  5 units at bedtime at Office Visit on 08/12/2023 Lantus  dose was Titrated upward throughout pregnancy Humulin R  Insulin  added during pregnancy as well Doses Prior to Delivery were as follows: Humulin R  29 units TID Lantus  52 units BID   Delivered via CS this AM CBGs well controlled since 5am today off Insulin  No Insulin  has been given since pt arrived to the hospital    MD/ CNM- Suspect pt will not need insulin  postpartum  Note orders to check CBGs QAM starting 08/16  May consider increasing the frequency of CBG checks to TID [facility] for tomorrow so we can see her glucose trends while eating  If pt's CBGs stay below 150, recommend no meds for home and have pt follow up with PCP for repeat A1c and CBG review in 3 months  If pt has CBGs 150+ range, recommend Start Metformin  500 mg daily and have pt follow up with PCP for repeat A1c and CBG review in 3 months   --Will follow patient during hospitalization--  Adina Rudolpho Arrow RN, MSN, CDCES Diabetes Coordinator Inpatient Glycemic Control Team Team Pager: [phone] (8a-5p)

## 2024-01-09 NOTE — Progress Notes (Signed)
 38+0 with A2gdm , suboptimal control glucose values. Scheduled repeat LTCS and elective BTL  Glucose this am 85 . No food no insulin  taken this am  All questions answered . Proceed

## 2024-01-09 NOTE — Lactation Note (Signed)
 This note was copied from a baby's chart. Lactation Consultation Note  Patient Name: Susan Shelton Date: 01/09/2024 Age:35 hours Reason for consult: Follow-up assessment;Early term 37-38.6wks   Maternal Data    Feeding Mother's Current Feeding Choice: Breast Milk and Formula Mom had recently given baby formula because he wouldn't latch well for her, requests assistance now with latch, diaper changed and baby began rooting, fussy, after hand expression of right breast, baby latched easily to right breast in football hold, nursed x 15 min with some swallows noted, came off breast, mom burped baby, offered left breast in cradle hold, baby again latched easily, mom shown how to support breast and use chin pressure to widen latch at the breast, baby continues to nurse when room left, mom encouraged by baby's latch, also expressed interest in pumping her breasts if baby does not latch well tonight, DEBP  was set up for her in room, encouraged mom to request assistance with latch tonight or with pumping if she needs to pump.   LATCH Score Latch: Grasps breast easily, tongue down, lips flanged, rhythmical sucking.  Audible Swallowing: A few with stimulation  Type of Nipple: Everted at rest and after stimulation  Comfort (Breast/Nipple): Soft / non-tender  Hold (Positioning): Assistance needed to correctly position infant at breast and maintain latch.  LATCH Score: 8   Lactation Tools Discussed/Used Tools: Pump Breast pump type: Double-Electric Breast Pump Reason for Pumping: mom request  Interventions Interventions: Assisted with latch;Hand express;Coconut oil;Support pillows;DEBP;Education  Discharge Pump: Personal WIC Program: Yes  Consult Status Consult Status: Follow-up Date: 01/10/24 Follow-up type: In-patient    Aldona JONETTA Converse 01/09/2024, 5:50 PM

## 2024-01-10 LAB — CBC
HCT: 28.2 % — ABNORMAL LOW (ref 36.0–46.0)
Hemoglobin: 9 g/dL — ABNORMAL LOW (ref 12.0–15.0)
MCH: 28.3 pg (ref 26.0–34.0)
MCHC: 31.9 g/dL (ref 30.0–36.0)
MCV: 88.7 fL (ref 80.0–100.0)
Platelets: 336 K/uL (ref 150–400)
RBC: 3.18 MIL/uL — ABNORMAL LOW (ref 3.87–5.11)
RDW: 14.6 % (ref 11.5–15.5)
WBC: 10.8 K/uL — ABNORMAL HIGH (ref 4.0–10.5)
nRBC: 0 % (ref 0.0–0.2)

## 2024-01-10 LAB — GLUCOSE, CAPILLARY: Glucose-Capillary: 71 mg/dL (ref 70–99)

## 2024-01-10 MED ORDER — FERROUS SULFATE 325 (65 FE) MG PO TABS
325.0000 mg | ORAL_TABLET | Freq: Two times a day (BID) | ORAL | Status: DC
  Administered 2024-01-10 – 2024-01-12 (×4): 325 mg via ORAL
  Filled 2024-01-10 (×4): qty 1

## 2024-01-10 MED ORDER — HYDROCODONE-ACETAMINOPHEN 7.5-325 MG PO TABS
1.0000 | ORAL_TABLET | Freq: Once | ORAL | Status: AC | PRN
  Administered 2024-01-10: 1 via ORAL

## 2024-01-10 MED ORDER — OXYCODONE HCL 5 MG PO TABS
5.0000 mg | ORAL_TABLET | ORAL | Status: DC | PRN
  Administered 2024-01-10 – 2024-01-11 (×2): 10 mg via ORAL
  Administered 2024-01-11: 5 mg via ORAL
  Administered 2024-01-11 – 2024-01-12 (×7): 10 mg via ORAL
  Filled 2024-01-10 (×10): qty 2

## 2024-01-10 NOTE — Lactation Note (Signed)
 This note was copied from a baby's chart. Lactation Consultation Note  Patient Name: Susan Shelton Date: 01/10/2024 Age:35 hours Reason for consult: Follow-up assessment;Early term 37-38.6wks;Mother's request;Breastfeeding assistance   Maternal Data This is mom's 2nd baby, repeat C/S. Mom with AMA and history of A2GDM, anxiety and depression, and tobacco use.  On follow-up mom reports she has been putting the baby to breast and formula feeding. Mom reports she has been breastfeeding every other feeding. Currently mom reports her pain from her C/S is not under control, and she is tearful. LC updated care nurse. Care nurse will f/u with mom regarding mom's pain. Assisted mom at her request with breastfeeding and provided positioning and pillow support to improve her comfort. Has patient been taught Hand Expression?: Yes Does the patient have breastfeeding experience prior to this delivery?: Yes How long did the patient breastfeed?: 2 weeks with previous child who is now 61 years old.  Feeding Mother's Current Feeding Choice: Breast Milk and Formula Provided tips and strategies to maximize position and latch techniques. Baby latched well, a few drips of formula used at mom's breast to encourage baby to suckle. Baby did actively suck with swallows at both breasts in football hold. Baby appeared content after feeding. Discussed with mom to offer baby to breast at each feeding and provide supplemental milk based on baby's satiety as mom wants to breastfeed and also give formula supplement. LATCH Score Latch: Grasps breast easily, tongue down, lips flanged, rhythmical sucking.  Audible Swallowing: Spontaneous and intermittent  Type of Nipple: Everted at rest and after stimulation  Comfort (Breast/Nipple): Soft / non-tender  Hold (Positioning): Assistance needed to correctly position infant at breast and maintain latch.  LATCH Score: 9   Interventions Interventions: Breast  feeding basics reviewed;Assisted with latch;Breast massage;Hand express;Breast compression;Adjust position;Support pillows;Position options;Education  Discharge Pump: Personal;Hands Free (Medela Hands Free)  Consult Status Consult Status: Follow-up Date: 01/11/24 Follow-up type: In-patient  Update provided to care nurse.  [name] 01/10/2024, 4:23 PM

## 2024-01-10 NOTE — Progress Notes (Signed)
 Post Partum Day 1  Subjective: Doing well, no concerns. Ambulating without difficulty, pain managed with PO meds, tolerating regular diet, and voiding without difficulty.   No fever/chills, chest pain, shortness of breath, nausea/vomiting, or leg pain. No nipple or breast pain. No headache, visual changes, or RUQ/epigastric pain.  Objective: BP 139/85 (BP Location: Right Arm)   Pulse 93   Temp 98.6 F (37 C) (Oral)   Resp 20   Ht 5' 3 (1.6 m)   Wt 102.5 kg   LMP 04/18/2023 (Exact Date)   SpO2 100%   Breastfeeding Unknown   BMI 40.03 kg/m    Physical Exam:  General: alert and cooperative Breasts: soft/nontender CV: RRR Pulm: nl effort Abdomen: soft, non-tender Uterine Fundus: firm Incision: healing well Perineum: intact Lochia: appropriate DVT Evaluation: No evidence of DVT seen on physical exam. Edinburgh:      No data to display           Recent Labs    01/08/24 0907 01/10/24 0648  HGB 11.0* 9.0*  HCT 33.6* 28.2*  WBC 9.7 10.8*  PLT 449* 336    Assessment/Plan: 33 y.o. G2P2002 postpartum day # 1  1. Continue routine postpartum care  2. Infant feeding status: breast and formula feeding -Lactation consult PRN for breastfeeding   3. Contraception plan: bilateral tubal ligation completed  4. Acute blood loss anemia - clinically significant.  -Hemodynamically stable and asymptomatic -Intervention: start on oral supplementation with ferrous sulfate  325 mg   5. Immunization status:   all immunizations up to date  6. H/o GHTN  Vitals:   01/09/24 0952 01/09/24 1000 01/09/24 1015 01/09/24 1030  BP: 135/61 128/78 128/85 121/82   01/09/24 1100 01/09/24 1147 01/09/24 1308 01/09/24 1552  BP: 130/79 115/82 (!) 136/59 139/70   01/09/24 2051 01/09/24 2324 01/10/24 0423 01/10/24 0758  BP: (!) 141/75 137/83 (!) 131/59 139/85     Disposition: Continue inpatient postpartum care    LOS: 1 day   Leianne Callins, CNM 01/10/2024, 9:56 AM

## 2024-01-10 NOTE — Progress Notes (Signed)
 Patient was offered assistance by nurse to apply her abdominal binder. Patient declined assist and states she will put it on tomorrow.

## 2024-01-10 NOTE — Anesthesia Postprocedure Evaluation (Signed)
 Anesthesia Post Note  Patient: Susan Shelton  Procedure(s) Performed: CESAREAN SECTION, WITH BILATERAL TUBAL LIGATION (Bilateral: Abdomen)  Patient location during evaluation: Mother Baby Anesthesia Type: Spinal Level of consciousness: awake and alert Pain management: pain level controlled Vital Signs Assessment: post-procedure vital signs reviewed and stable Respiratory status: spontaneous breathing, nonlabored ventilation and respiratory function stable Cardiovascular status: stable Postop Assessment: no headache, no backache and epidural receding Anesthetic complications: no   No notable events documented.   Last Vitals:  Vitals:   01/10/24 0423 01/10/24 0758  BP: (!) 131/59 139/85  Pulse:  93  Resp: 20 20  Temp: 36.7 C 37 C  SpO2: 100% 100%    Last Pain:  Vitals:   01/10/24 0758  TempSrc: Oral  PainSc:                  Debby Mines

## 2024-01-10 NOTE — Anesthesia Post-op Follow-up Note (Signed)
  Anesthesia Pain Follow-up Note  Patient: Susan Shelton  Day #: 1  Date of Follow-up: 01/10/2024 Time: 8:54 AM  Last Vitals:  Vitals:   01/10/24 0423 01/10/24 0758  BP: (!) 131/59 139/85  Pulse:  93  Resp: 20 20  Temp: 36.7 C 37 C  SpO2: 100% 100%    Level of Consciousness: alert  Pain: none   Side Effects:None  Catheter Site Exam:clean, dry, no drainage  Anti-Coag Meds (From admission, onward)    Start     Dose/Rate Route Frequency Ordered Stop   01/10/24 0800  enoxaparin  (LOVENOX ) injection 40 mg        40 mg Subcutaneous Every 24 hours 01/09/24 1201          Plan: D/C from anesthesia care at surgeon's request  [facility]

## 2024-01-11 MED ORDER — MUPIROCIN CALCIUM 2 % EX CREA
TOPICAL_CREAM | Freq: Two times a day (BID) | CUTANEOUS | Status: DC
Start: 2024-01-11 — End: 2024-01-12
  Filled 2024-01-11: qty 15

## 2024-01-11 MED ORDER — NIFEDIPINE ER OSMOTIC RELEASE 30 MG PO TB24
30.0000 mg | ORAL_TABLET | Freq: Every day | ORAL | Status: DC
  Administered 2024-01-11 – 2024-01-12 (×2): 30 mg via ORAL
  Filled 2024-01-11 (×2): qty 1

## 2024-01-11 NOTE — Lactation Note (Signed)
 This note was copied from a baby's chart. Lactation Consultation Note  Patient Name: Susan Shelton Unijb'd Date: 01/11/2024 Age:35 hours Reason for consult: Follow-up assessment;Early term 37-38.6wks   Maternal Data Follow up assessment w/ 49hr old baby Susan and mom.  Mom stated feedings were going ok.  MOB verbalized that she is offering infant the breast, but not putting him to the left breast because she is having a hard time with latching.  Formula is being given after a feeding.    Feeding Mother's Current Feeding Choice: Breast Milk and Formula  No feeding observed during this visit.  Patient would like lactation to come back to help with positioning of infant on the left breast.  Lactation Tools Discussed/Used Mom expressed that she didn't know how to use the DEBP or the manual pump.  LC provided education on how to use both pumps.  Moms nipples were measured.  18mm flanges were provided but mom will most likely need a 16mm flange for her nipples.    Interventions Interventions: Coconut oil;Education  Coconut oil was provided to patient for her nipples and for lubrication of flanges.   Consult Status Consult Status: Follow-up Follow-up type: In-patient    Saw Mendenhall S Torion Hulgan 01/11/2024, 11:04 AM

## 2024-01-11 NOTE — Progress Notes (Signed)
 Baby found on mom's belly when sleeping. Woke mom up and educated on safe sleep. Mom instill she would call out when she was done feeding to have the baby swaddled and put in bassinet.

## 2024-01-11 NOTE — Progress Notes (Signed)
 Post Partum Day 2  Subjective: Doing well, no concerns. Ambulating without difficulty, pain managed with PO meds, tolerating regular diet, and voiding without difficulty.   No fever/chills, chest pain, shortness of breath, nausea/vomiting, or leg pain. No nipple or breast pain. No headache, visual changes, or RUQ/epigastric pain.  Objective: BP (!) 145/89 (BP Location: Right Arm)   Pulse (!) 101   Temp 97.9 F (36.6 C) (Oral)   Resp 18   Ht 5' 3 (1.6 m)   Wt 102.5 kg   LMP 04/18/2023 (Exact Date)   SpO2 99%   Breastfeeding Unknown   BMI 40.03 kg/m    Physical Exam:  General: alert and cooperative Breasts: soft/nontender CV: RRR Pulm: nl effort Abdomen: soft, non-tender Uterine Fundus: firm Incision: healing well Perineum: intact Lochia: appropriate DVT Evaluation: No evidence of DVT seen on physical exam. Edinburgh:      No data to display           Recent Labs    01/08/24 0907 01/10/24 0648  HGB 11.0* 9.0*  HCT 33.6* 28.2*  WBC 9.7 10.8*  PLT 449* 336    Assessment/Plan: 33 y.o. G2P2002 postpartum day # 2  1. Continue routine postpartum care  2. Infant feeding status: breast and formula feeding -Lactation consult PRN for breastfeeding   3. Contraception plan: bilateral tubal ligation completed  4. Acute blood loss anemia - clinically significant.  -Hemodynamically stable and asymptomatic -Intervention: start on oral supplementation with ferrous sulfate  325 mg   5. Immunization status:   all immunizations up to date  6. Elevated BPs -- Starting Procardia  30mg  XL Vitals:   01/09/24 1100 01/09/24 1147 01/09/24 1308 01/09/24 1552  BP: 130/79 115/82 (!) 136/59 139/70   01/09/24 2051 01/09/24 2324 01/10/24 0423 01/10/24 0758  BP: (!) 141/75 137/83 (!) 131/59 139/85   01/10/24 1500 01/10/24 1953 01/11/24 0501 01/11/24 0745  BP: (!) 145/89 (!) 141/79 135/76 (!) 145/89     Disposition: Continue inpatient postpartum care    LOS: 2 days    DELON COE, CNM 01/11/2024, 8:54 AM

## 2024-01-11 NOTE — Plan of Care (Signed)
 Problem: Education: Goal: Knowledge of General Education information will improve Description: Including pain rating scale, medication(s)/side effects and non-pharmacologic comfort measures Outcome: Progressing   Problem: Health Behavior/Discharge Planning: Goal: Ability to manage health-related needs will improve Outcome: Progressing   Problem: Clinical Measurements: Goal: Ability to maintain clinical measurements within normal limits will improve Outcome: Progressing Goal: Will remain free from infection Outcome: Progressing Goal: Diagnostic test results will improve Outcome: Progressing Goal: Respiratory complications will improve Outcome: Progressing Goal: Cardiovascular complication will be avoided Outcome: Progressing   Problem: Activity: Goal: Risk for activity intolerance will decrease Outcome: Progressing   Problem: Nutrition: Goal: Adequate nutrition will be maintained Outcome: Progressing   Problem: Coping: Goal: Level of anxiety will decrease Outcome: Progressing   Problem: Elimination: Goal: Will not experience complications related to bowel motility Outcome: Progressing Goal: Will not experience complications related to urinary retention Outcome: Progressing   Problem: Pain Managment: Goal: General experience of comfort will improve and/or be controlled Outcome: Progressing   Problem: Safety: Goal: Ability to remain free from injury will improve Outcome: Progressing   Problem: Skin Integrity: Goal: Risk for impaired skin integrity will decrease Outcome: Progressing   Problem: Education: Goal: Knowledge of Childbirth will improve Outcome: Progressing Goal: Ability to make informed decisions regarding treatment and plan of care will improve Outcome: Progressing Goal: Ability to state and carry out methods to decrease the pain will improve Outcome: Progressing Goal: Individualized Educational Video(s) Outcome: Progressing   Problem:  Coping: Goal: Ability to verbalize concerns and feelings about labor and delivery will improve Outcome: Progressing   Problem: Life Cycle: Goal: Ability to make normal progression through stages of labor will improve Outcome: Progressing Goal: Ability to effectively push during vaginal delivery will improve Outcome: Progressing   Problem: Role Relationship: Goal: Will demonstrate positive interactions with the child Outcome: Progressing   Problem: Safety: Goal: Risk of complications during labor and delivery will decrease Outcome: Progressing   Problem: Pain Management: Goal: Relief or control of pain from uterine contractions will improve Outcome: Progressing   Problem: Education: Goal: Ability to describe self-care measures that may prevent or decrease complications (Diabetes Survival Skills Education) will improve Outcome: Progressing Goal: Individualized Educational Video(s) Outcome: Progressing   Problem: Coping: Goal: Ability to adjust to condition or change in health will improve Outcome: Progressing   Problem: Fluid Volume: Goal: Ability to maintain a balanced intake and output will improve Outcome: Progressing   Problem: Health Behavior/Discharge Planning: Goal: Ability to identify and utilize available resources and services will improve Outcome: Progressing Goal: Ability to manage health-related needs will improve Outcome: Progressing   Problem: Metabolic: Goal: Ability to maintain appropriate glucose levels will improve Outcome: Progressing   Problem: Nutritional: Goal: Maintenance of adequate nutrition will improve Outcome: Progressing Goal: Progress toward achieving an optimal weight will improve Outcome: Progressing   Problem: Skin Integrity: Goal: Risk for impaired skin integrity will decrease Outcome: Progressing   Problem: Tissue Perfusion: Goal: Adequacy of tissue perfusion will improve Outcome: Progressing   Problem: Education: Goal:  Knowledge of the prescribed therapeutic regimen will improve Outcome: Progressing Goal: Understanding of sexual limitations or changes related to disease process or condition will improve Outcome: Progressing Goal: Individualized Educational Video(s) Outcome: Progressing   Problem: Self-Concept: Goal: Communication of feelings regarding changes in body function or appearance will improve Outcome: Progressing   Problem: Skin Integrity: Goal: Demonstration of wound healing without infection will improve Outcome: Progressing   Problem: Education: Goal: Knowledge of condition will improve Outcome: Progressing Goal: Individualized  Educational Video(s) Outcome: Progressing Goal: Individualized Newborn Educational Video(s) Outcome: Progressing   Problem: Activity: Goal: Will verbalize the importance of balancing activity with adequate rest periods Outcome: Progressing Goal: Ability to tolerate increased activity will improve Outcome: Progressing

## 2024-01-12 ENCOUNTER — Encounter: Payer: Self-pay | Admitting: Obstetrics and Gynecology

## 2024-01-12 LAB — GLUCOSE, CAPILLARY: Glucose-Capillary: 94 mg/dL (ref 70–99)

## 2024-01-12 LAB — SURGICAL PATHOLOGY

## 2024-01-12 MED ORDER — IBUPROFEN 600 MG PO TABS
600.0000 mg | ORAL_TABLET | Freq: Four times a day (QID) | ORAL | 0 refills | Status: AC
Start: 2024-01-12 — End: ?

## 2024-01-12 MED ORDER — NICOTINE 14 MG/24HR TD PT24: 14.0000 mg | MEDICATED_PATCH | Freq: Every day | TRANSDERMAL | 0 refills | Status: AC

## 2024-01-12 MED ORDER — SIMETHICONE 80 MG PO CHEW: 80.0000 mg | CHEWABLE_TABLET | ORAL | 0 refills | Status: AC | PRN

## 2024-01-12 MED ORDER — SENNOSIDES-DOCUSATE SODIUM 8.6-50 MG PO TABS: 2.0000 | ORAL_TABLET | Freq: Every day | ORAL | Status: AC

## 2024-01-12 MED ORDER — ACETAMINOPHEN 500 MG PO TABS: 1000.0000 mg | ORAL_TABLET | Freq: Four times a day (QID) | ORAL | Status: AC

## 2024-01-12 MED ORDER — NIFEDIPINE ER 30 MG PO TB24: 30.0000 mg | ORAL_TABLET | Freq: Every day | ORAL | 0 refills | Status: AC

## 2024-01-12 MED ORDER — HYDROCORTISONE 2 % EX CREA: 1.0000 | TOPICAL_CREAM | Freq: Two times a day (BID) | CUTANEOUS | 0 refills | Status: AC

## 2024-01-12 MED ORDER — MUPIROCIN CALCIUM 2 % EX CREA: TOPICAL_CREAM | Freq: Two times a day (BID) | CUTANEOUS | 0 refills | Status: AC

## 2024-01-12 MED ORDER — OXYCODONE HCL 5 MG PO TABS: 5.0000 mg | ORAL_TABLET | ORAL | 0 refills | Status: AC | PRN

## 2024-01-12 MED ORDER — FERROUS SULFATE 325 (65 FE) MG PO TABS: 325.0000 mg | ORAL_TABLET | Freq: Two times a day (BID) | ORAL | Status: AC

## 2024-01-12 MED ORDER — COCONUT OIL OIL: 1.0000 | TOPICAL_OIL | Status: AC | PRN

## 2024-01-12 NOTE — Lactation Note (Signed)
 This note was copied from a baby's chart. Lactation Consultation Note  Patient Name: Susan Shelton Date: 01/12/2024 Age:35 hours Reason for consult: Follow-up assessment;Other (Comment) (Discharge Education)   Maternal Data Discharge education w/ MOB and infant.  Mom stated that she is trying to pump and still not getting anything.    Feeding Mother's Current Feeding Choice: Breast Milk and Formula  Interventions Interventions: Breast feeding basics reviewed;Education;CDC milk storage guidelines  LC reviewed the milk making process and informed mom to trust her body.  Discharge Discharge Education: Engorgement and breast care;Warning signs for feeding baby;Outpatient recommendation  Education on engorgement prevention/treatment was discussed as well as breastmilk storage guidelines.  LC provided patient with a handout on breastmilk storage guidelines from [facility]. [facility] outpatient lactation services phone number written on the white board in the room.  Patient verbalized understanding.  LC also provided education from the postpartum book about warning signs to look for in a poor feeding.    Consult Status Consult Status: Complete Follow-up type: Call as needed    Crystalynn Mcinerney S Riyan Haile 01/12/2024, 11:20 AM

## 2024-01-12 NOTE — Discharge Instructions (Signed)

## 2024-02-04 ENCOUNTER — Encounter: Payer: Self-pay | Admitting: Obstetrics and Gynecology

## 2024-02-16 NOTE — Progress Notes (Signed)
 Postpartum Examination:    Susan Shelton is a 33 y.o. female here for postpartum visit. Routine care  S/p LTCS and BTL  Breast and bottle  On 150 wellbutrin    A2GDM  insulin  , 2Hr GTT -wnl  Off Nifedipine  BP nl  OB/GYN History:  OB History     Gravida  2   Para  2   Term  2   Preterm      AB      Living  2      SAB      IAB      Ectopic      Molar      Multiple      Live Births  2         Recent pregnancy  Date of delivery:815/25 Breast Feeding: x Yes  []  No Menses: Last Pap: 7/24 Rubella Immune: [x]  Yes  []  No Edinburgh Depression Screening Score:  Edinburgh Postnatal Depression Scale    Questions answered by the patient's mother to describe her feelings over the last 7 days:  1. I have been able to laugh and see the funny side of things: Not quite so much now   2. I have looked forward with enjoyment to things: As much as I ever did   3. I have blamed myself unnecessarily when things went wrong: Not very often   4. I have been anxious or worried for no good reason: Yes, sometimes   5. I have felt scared or panicky for no very good reason: No, not much   6. Things have been getting on top of me: Yes, sometimes I haven't been coping as well as usual   7. I have been so unhappy that I have had difficulty sleeping: Not at all   8. I have felt sad or miserable: Not very often   9. I have been so unhappy that I have been crying: Only occasionally   10.The thought of harming myself has occurred to me: Never   Total Score:  9   Interpretation of Edinburgh Total Score:   0 - 8 points: Low probability of depression 8 - 12 points: Most likely just dealing w/ a new baby or the baby blues 13 - 14 points: Signs leading to possibility of PPD; take preventative                          Measures 15 + points: High probability of experiencing clinical depression    Exam:   Vitals:   02/16/24 1044  BP: 123/84  Pulse: 86    Body mass index is  39.45 kg/m.  WDWN / black female in NAD   Lungs: CTA  CV : RRR without murmur   Breast: exam done in sitting and lying position : No dimpling or retraction, no dominant mass, no spontaneous discharge, no axillary adenopathy Neck:  no thyromegaly Abdomen: soft , no mass, normal active bowel sounds,  non-tender, no rebound tenderness Pelvic: tanner stage 5 ,  External genitalia: vulva /labia no lesions Urethra: no prolapse Vagina: normal physiologic d/c Cervix: no lesions, no cervical motion tenderness   Uterus: normal size shape and contour, non-tender Adnexa: no mass,  non-tender   Rectovaginal:  Pelvic exam done to collect PAP Chaperone present   Impression:   The primary encounter diagnosis was Postpartum examination following cesarean delivery ([facility]-HCC). Diagnoses of Transient hypertension in pregnancy, postpartum (HHS-HCC) and Insulin  controlled gestational diabetes mellitus (GDM) in  second trimester (HHS-HCC) were also pertinent to this visit.    Plan:  Take BP at home goal < 130/80  Yearly glucose monitoring   Self breast exam taught.  No follow-ups on file.  DEBBY CLARYCE DINSMORE, MD
